# Patient Record
Sex: Female | Born: 1990 | Race: White | Hispanic: No | Marital: Single | Smoking: Never smoker
Health system: Southern US, Community
[De-identification: ages and names within clinical notes are randomized; demographics above are authoritative.]

## PROBLEM LIST (undated history)

## (undated) DIAGNOSIS — E282 Polycystic ovarian syndrome: Secondary | ICD-10-CM

## (undated) DIAGNOSIS — E669 Obesity, unspecified: Secondary | ICD-10-CM

## (undated) DIAGNOSIS — L409 Psoriasis, unspecified: Secondary | ICD-10-CM

## (undated) DIAGNOSIS — D509 Iron deficiency anemia, unspecified: Secondary | ICD-10-CM

## (undated) HISTORY — DX: Psoriasis, unspecified: L40.9

## (undated) HISTORY — DX: Obesity, unspecified: E66.9

## (undated) HISTORY — DX: Iron deficiency anemia, unspecified: D50.9

---

## 2013-12-24 ENCOUNTER — Ambulatory Visit: Payer: Self-pay | Admitting: Family Medicine

## 2014-01-18 ENCOUNTER — Ambulatory Visit: Payer: Self-pay | Admitting: Family Medicine

## 2014-02-05 ENCOUNTER — Emergency Department: Payer: Self-pay | Admitting: Emergency Medicine

## 2014-02-05 LAB — URINALYSIS, COMPLETE
BILIRUBIN, UR: NEGATIVE
Blood: NEGATIVE
Glucose,UR: NEGATIVE mg/dL (ref 0–75)
Hyaline Cast: 1
Ketone: NEGATIVE
LEUKOCYTE ESTERASE: NEGATIVE
Nitrite: NEGATIVE
PH: 5 (ref 4.5–8.0)
Protein: NEGATIVE
Specific Gravity: 1.032 (ref 1.003–1.030)
WBC UR: 3 /HPF (ref 0–5)

## 2014-02-05 LAB — COMPREHENSIVE METABOLIC PANEL
ALBUMIN: 3.9 g/dL (ref 3.4–5.0)
ALT: 26 U/L (ref 12–78)
Alkaline Phosphatase: 113 U/L
Anion Gap: 8 (ref 7–16)
BUN: 13 mg/dL (ref 7–18)
Bilirubin,Total: 0.3 mg/dL (ref 0.2–1.0)
Calcium, Total: 8.8 mg/dL (ref 8.5–10.1)
Chloride: 106 mmol/L (ref 98–107)
Co2: 24 mmol/L (ref 21–32)
Creatinine: 0.92 mg/dL (ref 0.60–1.30)
EGFR (African American): >60
EGFR (Non-African Amer.): >60
GLUCOSE: 92 mg/dL (ref 65–99)
Osmolality: 275 (ref 275–301)
Potassium: 3.7 mmol/L (ref 3.5–5.1)
SGOT(AST): 28 U/L (ref 15–37)
SODIUM: 138 mmol/L (ref 136–145)
Total Protein: 8.7 g/dL — ABNORMAL HIGH (ref 6.4–8.2)

## 2014-02-05 LAB — CBC WITH DIFFERENTIAL/PLATELET
Basophil #: 0.1 10*3/uL (ref 0.0–0.1)
Basophil %: 0.2 %
EOS ABS: 0.2 10*3/uL (ref 0.0–0.7)
EOS PCT: 1.1 %
HCT: 36.8 % (ref 35.0–47.0)
HGB: 11.8 g/dL — ABNORMAL LOW (ref 12.0–16.0)
Lymphocyte #: 2.3 10*3/uL (ref 1.0–3.6)
Lymphocyte %: 10.1 %
MCH: 23.6 pg — ABNORMAL LOW (ref 26.0–34.0)
MCHC: 31.9 g/dL — ABNORMAL LOW (ref 32.0–36.0)
MCV: 74 fL — ABNORMAL LOW (ref 80–100)
Monocyte #: 1.7 x10 3/mm — ABNORMAL HIGH (ref 0.2–0.9)
Monocyte %: 7.4 %
NEUTROS ABS: 18.4 10*3/uL — AB (ref 1.4–6.5)
Neutrophil %: 81.2 %
PLATELETS: 330 10*3/uL (ref 150–440)
RBC: 4.98 10*6/uL (ref 3.80–5.20)
RDW: 15.7 % — ABNORMAL HIGH (ref 11.5–14.5)
WBC: 22.6 10*3/uL — AB (ref 3.6–11.0)

## 2014-02-05 LAB — PREGNANCY, URINE: PREGNANCY TEST, URINE: NEGATIVE m[IU]/mL

## 2014-02-05 LAB — LIPASE, BLOOD: Lipase: 228 U/L (ref 73–393)

## 2015-03-07 IMAGING — CT CT ABD-PELV W/ CM
2 of 4 series · 16 of 46 positions shown, 18 images · IV contrast (isovue)
Comparison: US ABDOMEN LIMITED RUQ/ASCITES dated 02/06/2014

CLINICAL DATA: Upper abdominal pain, nausea.

EXAM:
CT ABDOMEN AND PELVIS WITH CONTRAST
TECHNIQUE: Multidetector CT imaging of the abdomen and pelvis was performed
using the standard protocol following bolus administration of
intravenous contrast.
CONTRAST:  100 cc Isovue 370 IV.

[Series 2: routine abd pel with · axial · 0.72mm/px · z∈[-869,-444]mm · 13 of 93 slices shown, 15 images]
[im 4/93  soft-tissue]
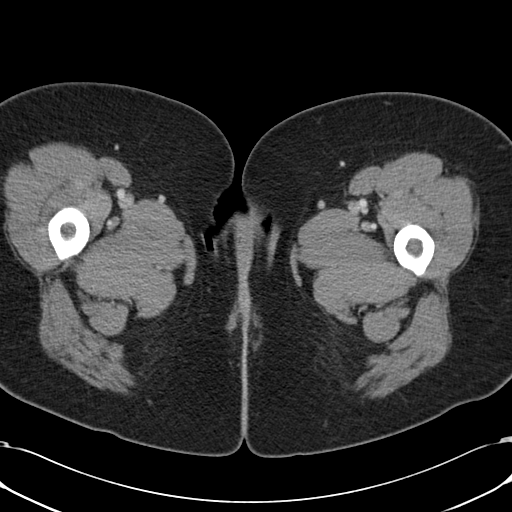
[im 4/93  bone]
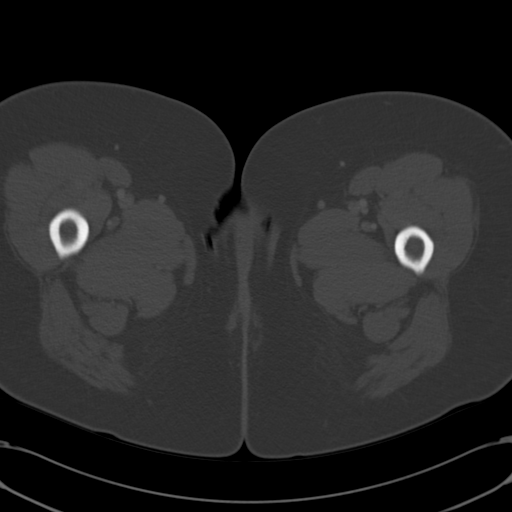
[im 12/93  soft-tissue]
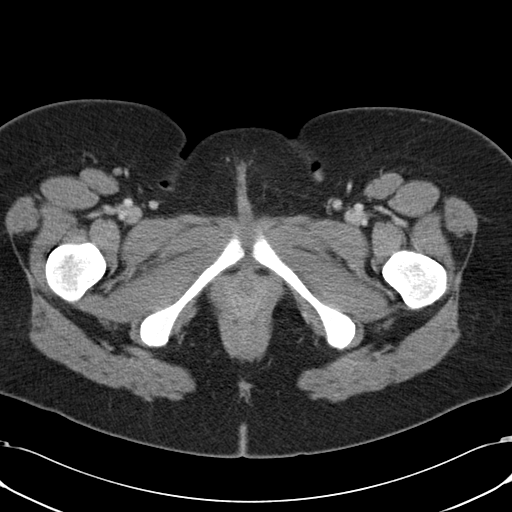
[im 20/93  soft-tissue]
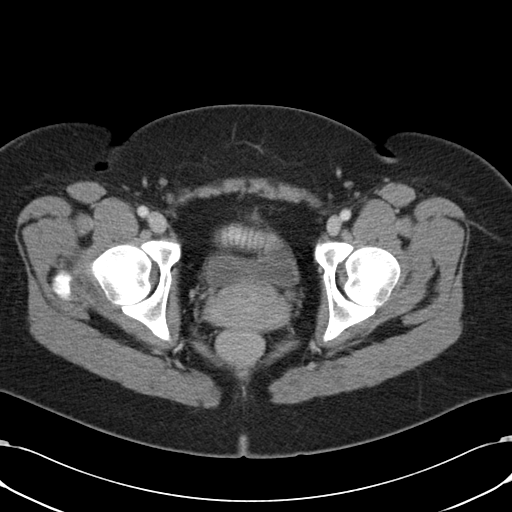
[im 27/93  soft-tissue]
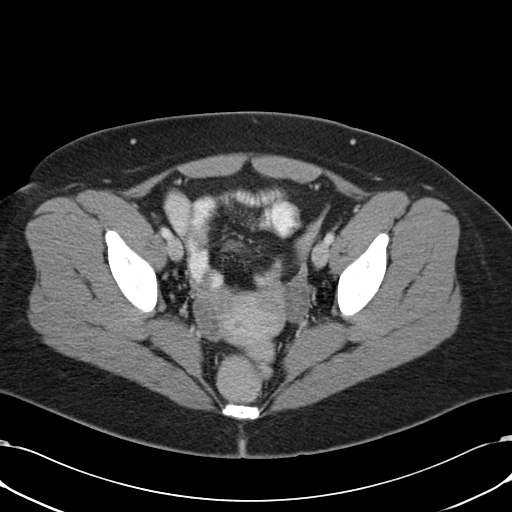
[im 31/93  soft-tissue]
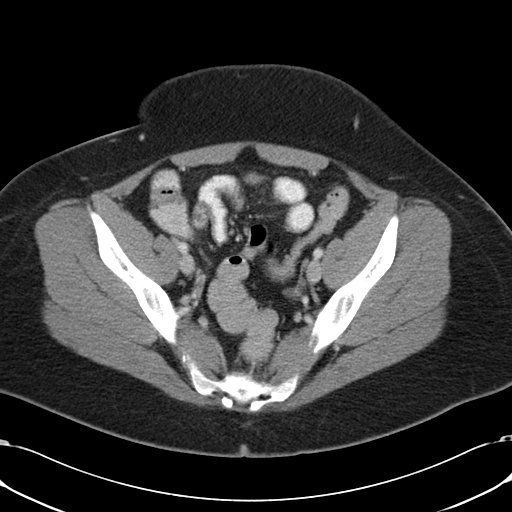
[im 39/93  soft-tissue]
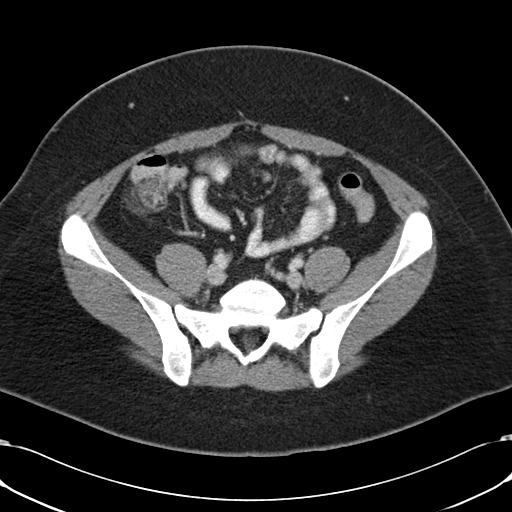
[im 47/93  soft-tissue]
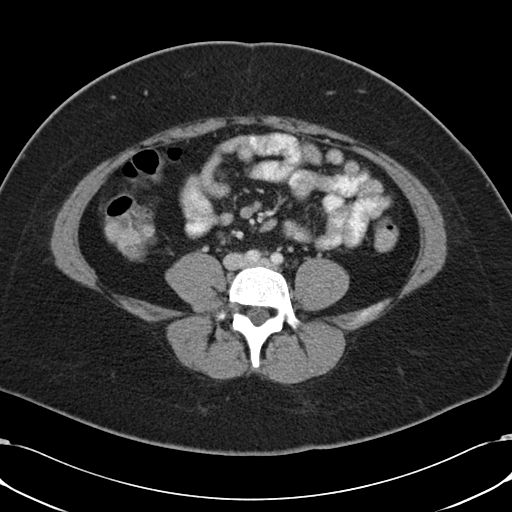
[im 54/93  soft-tissue]
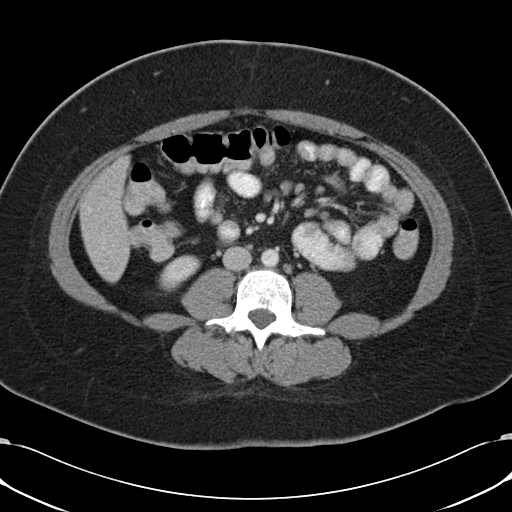
[im 62/93  soft-tissue]
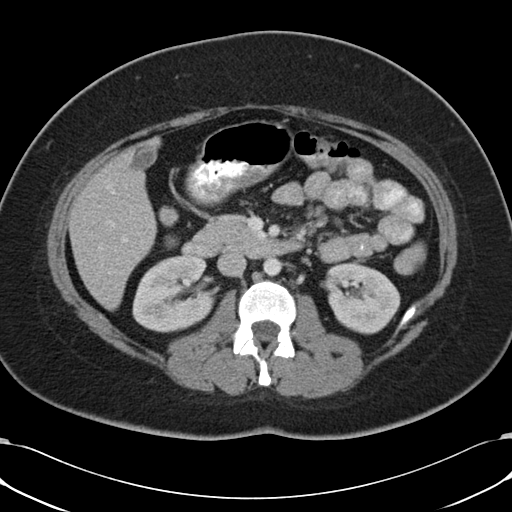
[im 62/93  bone]
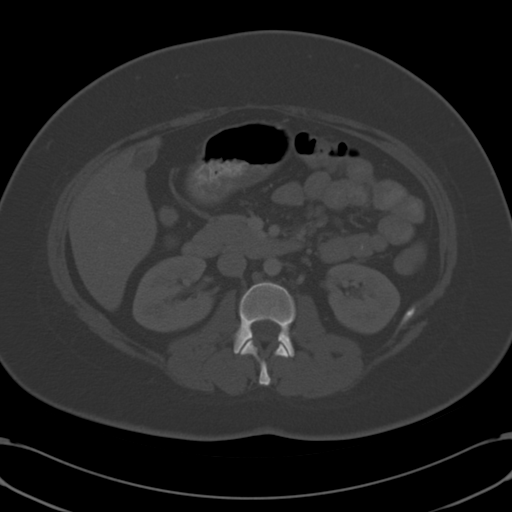
[im 66/93  soft-tissue]
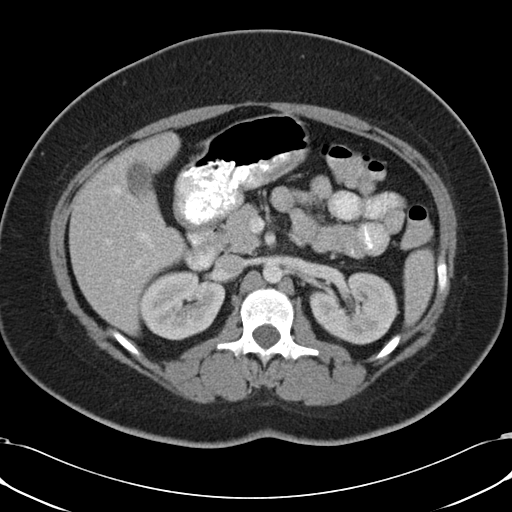
[im 73/93  soft-tissue]
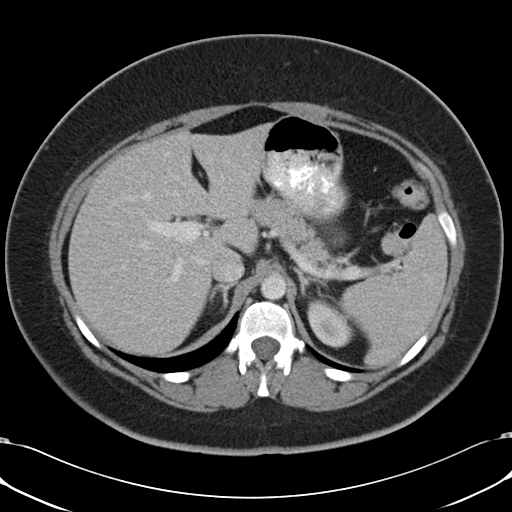
[im 81/93  soft-tissue]
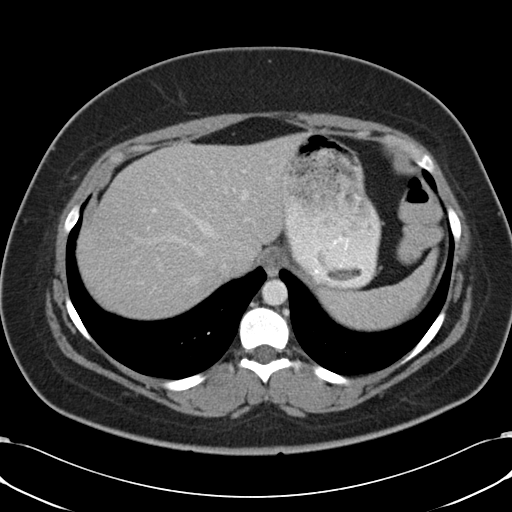
[im 89/93  soft-tissue]
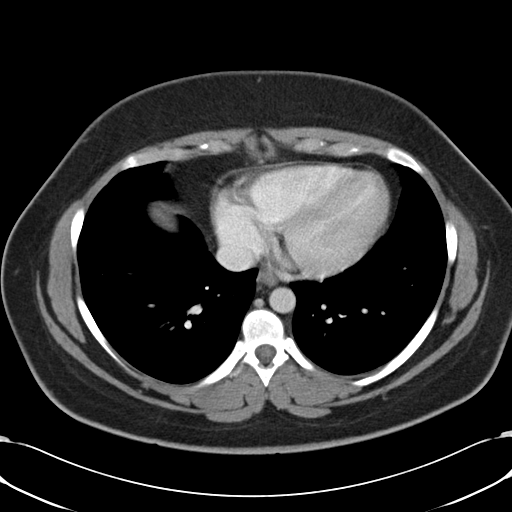

[Series 5: cor routine abd pel with · coronal · 0.62mm/px · 3 of 137 slices shown]
[im 46/137  soft-tissue]
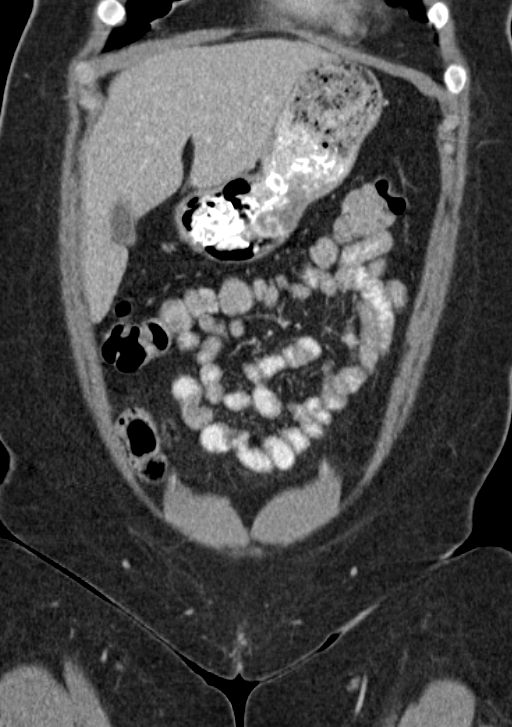
[im 61/137  soft-tissue]
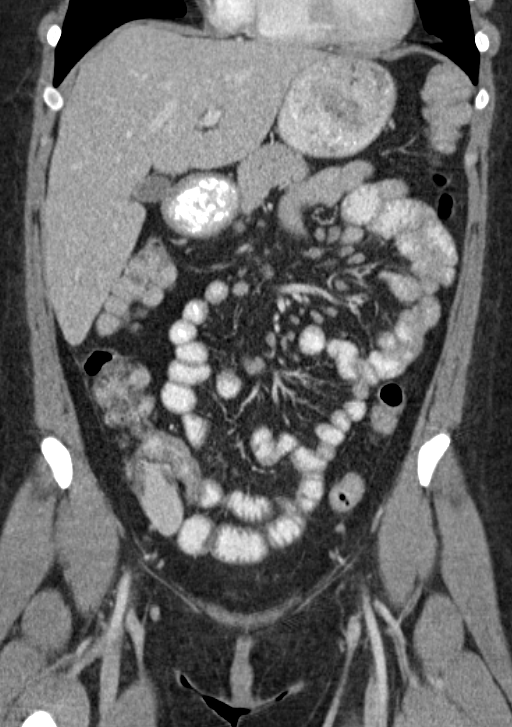
[im 76/137  soft-tissue]
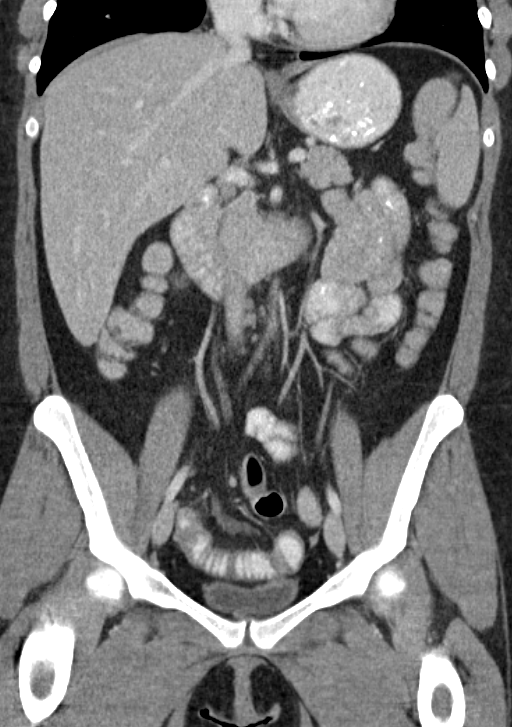

[16 of 46 positions shown; findings below may reference images not displayed]

FINDINGS: Lung bases are clear. No effusions. Heart is normal size.

Liver, gallbladder, spleen, pancreas, adrenals and kidneys are
normal.

Appendix is visualized and is normal. Uterus, adnexae and urinary
bladder unremarkable.

Mildly prominent central mesenteric lymph nodes may reflect
mesenteric adenitis. No free air or free fluid. Aorta is normal
caliber.

No acute bony abnormality.
IMPRESSION: Mildly prominent central mesenteric lymph nodes, question mesenteric
adenitis.

Normal appendix.

## 2015-03-07 IMAGING — US ABDOMEN ULTRASOUND LIMITED
1 series · 14 of 25 positions shown · non-contrast
Comparison: Concern none available

CLINICAL DATA: Right upper quadrant pain, with leukocytosis.

EXAM:
US ABDOMEN LIMITED - RIGHT UPPER QUADRANT

[Series 1: abdomen ultrasound limited · 0.45mm/px · 14 of 40 slices shown]
[im 1/40]
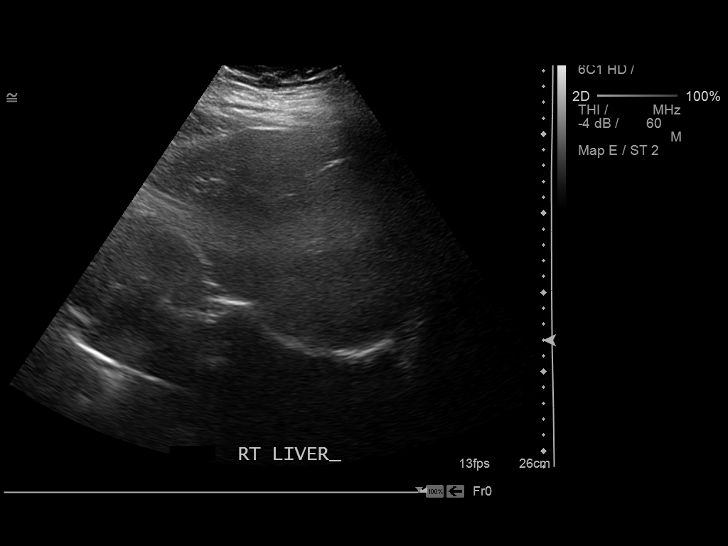
[im 4/40]
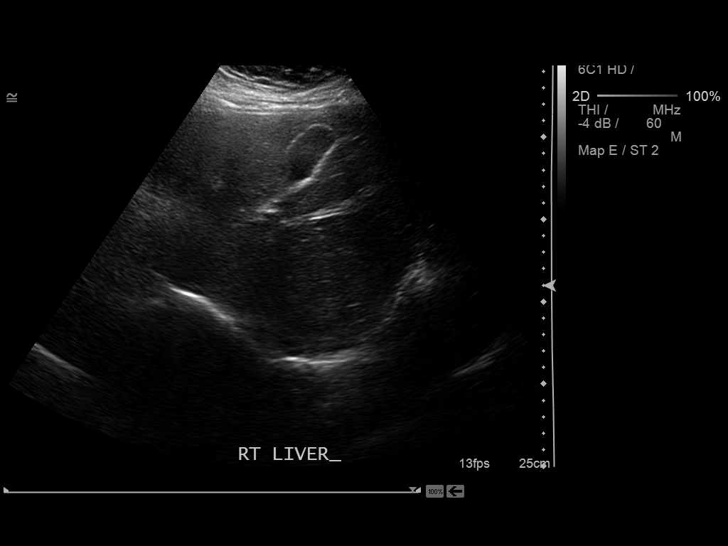
[im 7/40]
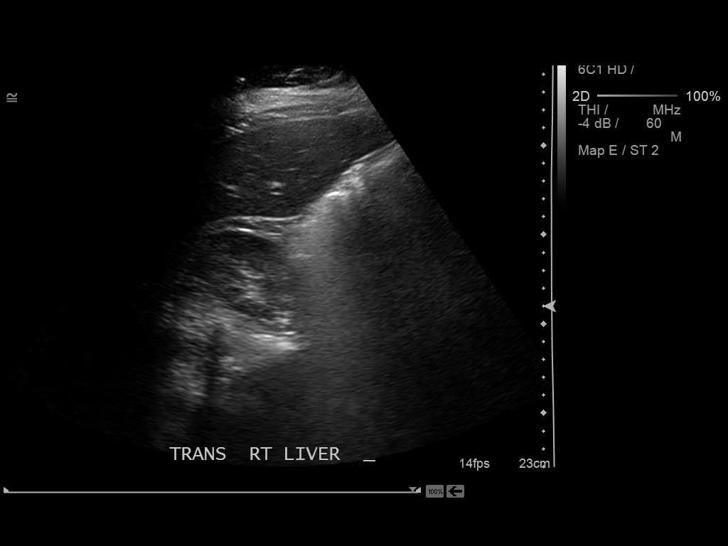
[im 10/40]
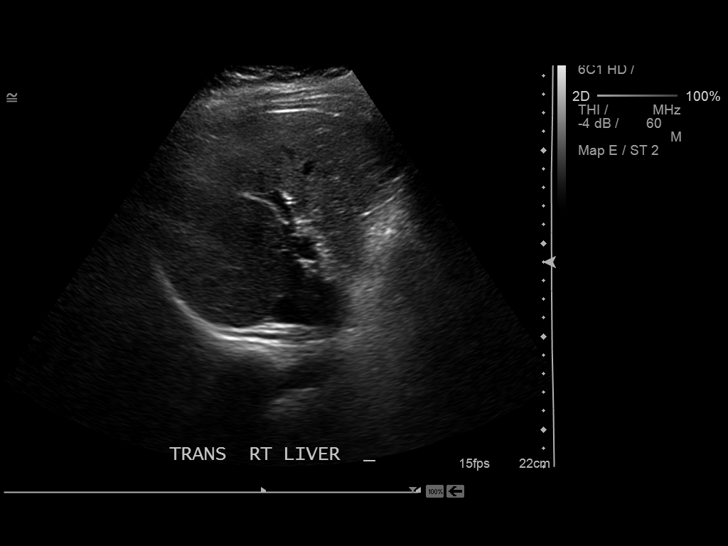
[im 14/40]
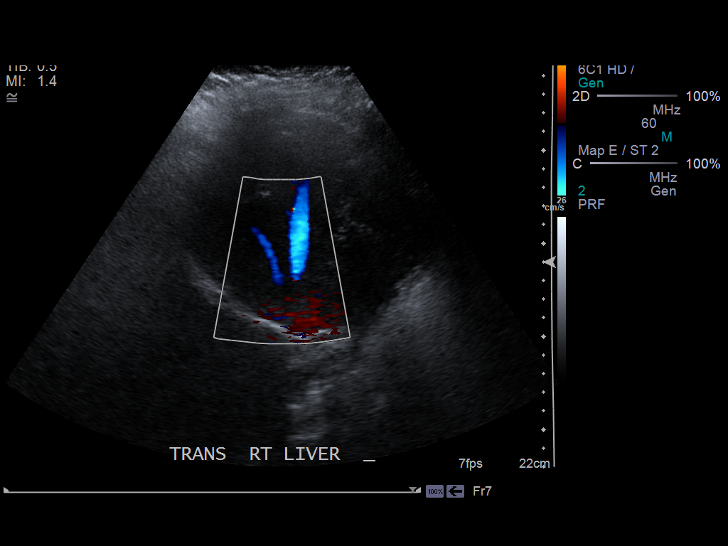
[im 15/40]
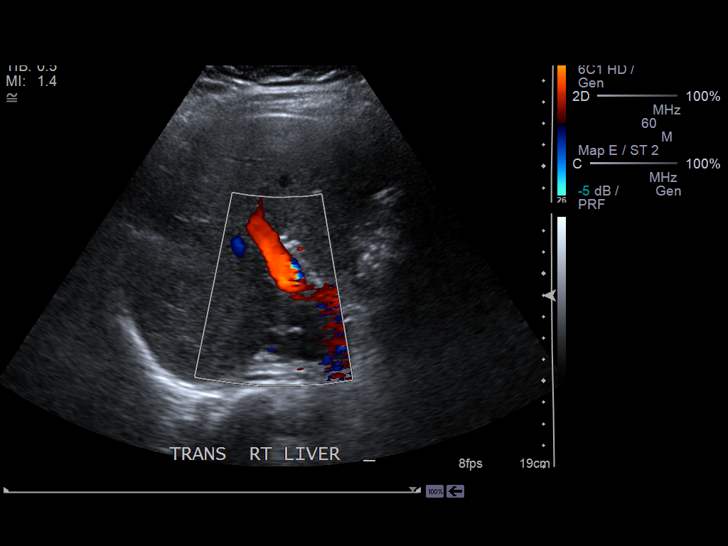
[im 18/40]
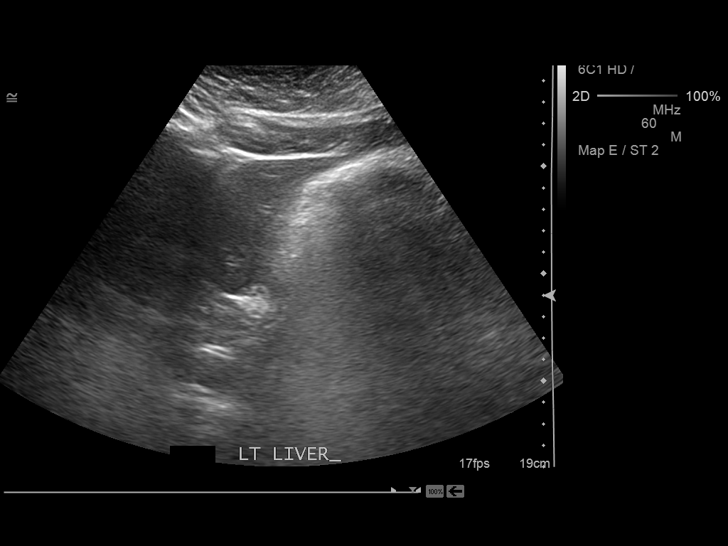
[im 22/40]
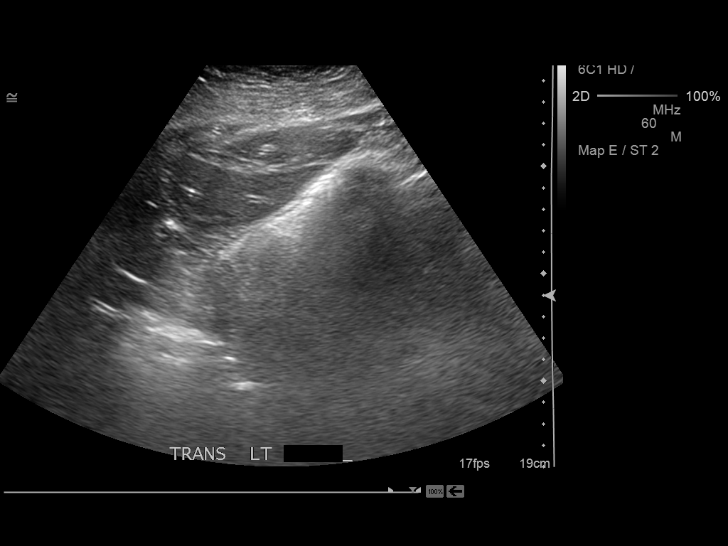
[im 25/40]
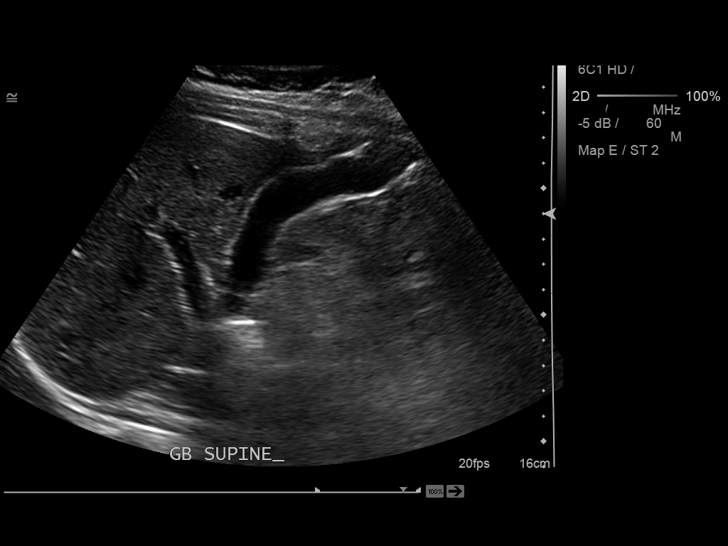
[im 27/40]
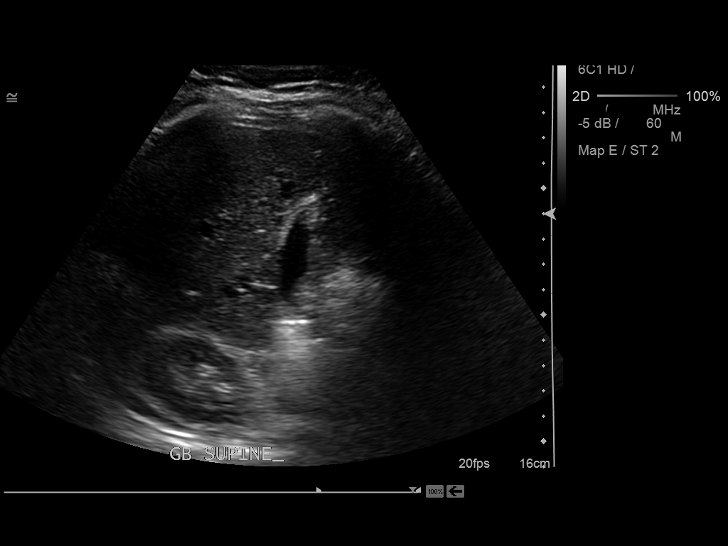
[im 30/40]
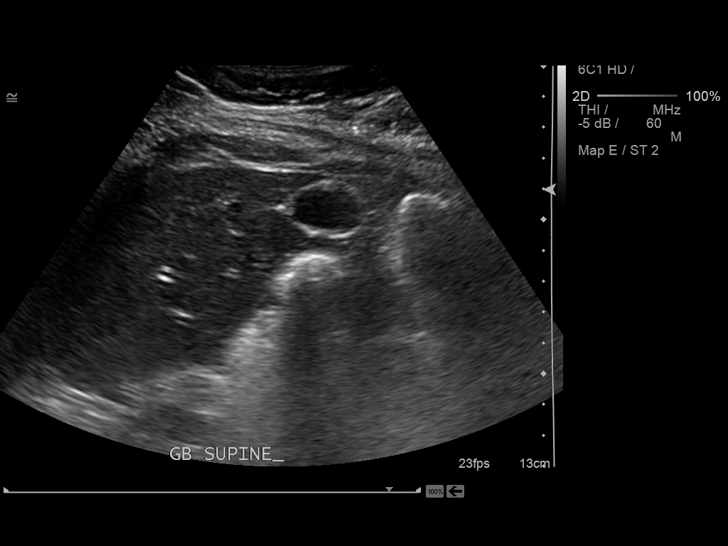
[im 33/40]
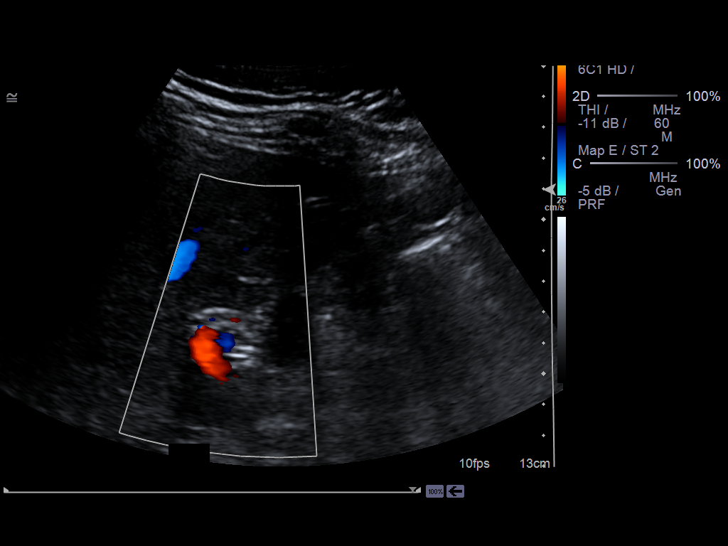
[im 36/40]
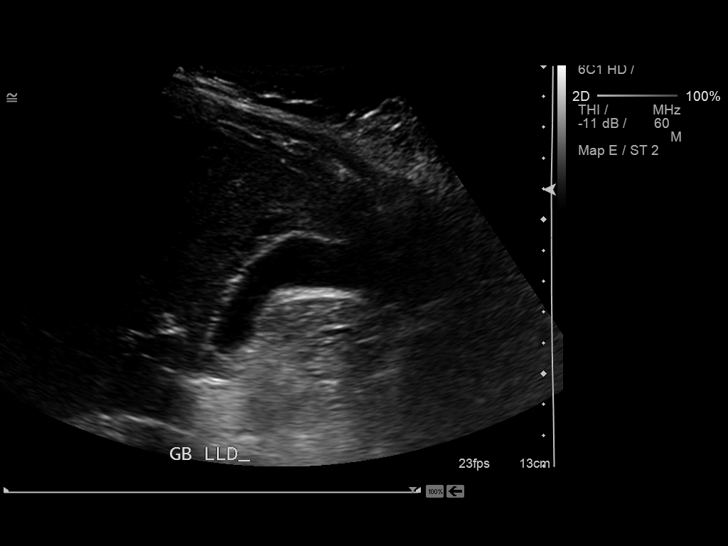
[im 40/40]
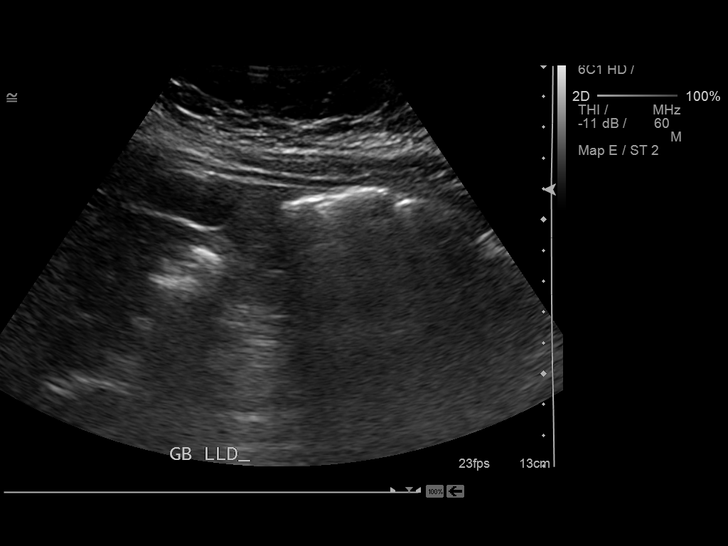

[14 of 25 positions shown; findings below may reference images not displayed]

FINDINGS: Gallbladder:

No gallstones or wall thickening visualized. No sonographic Murphy
sign noted.

Common bile duct:

Diameter: 4 mm

Liver:

The liver is 16 cm in craniocaudad dimension, overall normal
morphology in echogenicity, hepatopetal portal vein. No perihepatic
free fluid.
IMPRESSION: No sonographic findings of cholelithiasis nor cholecystitis.

  By: Savio Locklear

## 2015-06-03 ENCOUNTER — Encounter: Payer: Self-pay | Admitting: Emergency Medicine

## 2015-06-03 ENCOUNTER — Ambulatory Visit
Admission: EM | Admit: 2015-06-03 | Discharge: 2015-06-03 | Disposition: A | Discharge status: Home / Self Care | Payer: 59 | Attending: Internal Medicine | Admitting: Internal Medicine

## 2015-06-03 ENCOUNTER — Telehealth: Payer: Self-pay

## 2015-06-03 DIAGNOSIS — Z793 Long term (current) use of hormonal contraceptives: Secondary | ICD-10-CM | POA: Diagnosis not present

## 2015-06-03 DIAGNOSIS — E282 Polycystic ovarian syndrome: Secondary | ICD-10-CM | POA: Diagnosis not present

## 2015-06-03 DIAGNOSIS — R1032 Left lower quadrant pain: Secondary | ICD-10-CM | POA: Insufficient documentation

## 2015-06-03 DIAGNOSIS — L409 Psoriasis, unspecified: Secondary | ICD-10-CM | POA: Diagnosis not present

## 2015-06-03 DIAGNOSIS — R109 Unspecified abdominal pain: Secondary | ICD-10-CM | POA: Diagnosis present

## 2015-06-03 DIAGNOSIS — Z30018 Encounter for initial prescription of other contraceptives: Secondary | ICD-10-CM

## 2015-06-03 HISTORY — DX: Polycystic ovarian syndrome: E28.2

## 2015-06-03 LAB — CBC WITH DIFFERENTIAL/PLATELET
BASOS ABS: 0 10*3/uL (ref 0–0.1)
Basophils Relative: 0 %
Eosinophils Absolute: 0.2 10*3/uL (ref 0–0.7)
Eosinophils Relative: 2 %
HCT: 39.9 % (ref 35.0–47.0)
Hemoglobin: 13.1 g/dL (ref 12.0–16.0)
Lymphocytes Relative: 26 %
Lymphs Abs: 3 10*3/uL (ref 1.0–3.6)
MCH: 27.2 pg (ref 26.0–34.0)
MCHC: 32.8 g/dL (ref 32.0–36.0)
MCV: 82.9 fL (ref 80.0–100.0)
MONO ABS: 1 10*3/uL — AB (ref 0.2–0.9)
Monocytes Relative: 9 %
Neutro Abs: 7.2 10*3/uL — ABNORMAL HIGH (ref 1.4–6.5)
Neutrophils Relative %: 63 %
PLATELETS: 249 10*3/uL (ref 150–440)
RBC: 4.82 MIL/uL (ref 3.80–5.20)
RDW: 13.5 % (ref 11.5–14.5)
WBC: 11.5 10*3/uL — ABNORMAL HIGH (ref 3.6–11.0)

## 2015-06-03 LAB — PREGNANCY, URINE: PREG TEST UR: NEGATIVE

## 2015-06-03 LAB — URINALYSIS COMPLETE WITH MICROSCOPIC (ARMC ONLY)
BILIRUBIN URINE: NEGATIVE
Glucose, UA: NEGATIVE mg/dL
Hgb urine dipstick: NEGATIVE
Ketones, ur: NEGATIVE mg/dL
Nitrite: NEGATIVE
Protein, ur: NEGATIVE mg/dL
Specific Gravity, Urine: 1.025 (ref 1.005–1.030)
pH: 5.5 (ref 5.0–8.0)

## 2015-06-03 LAB — WET PREP, GENITAL
CLUE CELLS WET PREP: NONE SEEN
TRICH WET PREP: NONE SEEN
YEAST WET PREP: NONE SEEN

## 2015-06-03 LAB — CHLAMYDIA/NGC RT PCR (ARMC ONLY)
CHLAMYDIA TR: NOT DETECTED
N GONORRHOEAE: NOT DETECTED

## 2015-06-03 LAB — HCG, QUANTITATIVE, PREGNANCY: hCG, Beta Chain, Quant, S: <1 m[IU]/mL (ref <5)

## 2015-06-03 MED ORDER — NORGESTIM-ETH ESTRAD TRIPHASIC 0.18/0.215/0.25 MG-25 MCG PO TABS: 1.0000 | ORAL_TABLET | Freq: Every day | ORAL | Status: DC

## 2015-06-03 MED ORDER — CLOBETASOL PROPIONATE 0.05 % EX OINT: 1.0000 "application " | TOPICAL_OINTMENT | Freq: Two times a day (BID) | CUTANEOUS | Status: DC

## 2015-06-03 NOTE — Telephone Encounter (Signed)
Patient called and had left a message stating she had called the on call after hours nurse and was told to call our office when we opened.

## 2015-06-03 NOTE — Telephone Encounter (Signed)
Patient called back, I advised her we could get her in this afternoon. She wanted to know if we could go ahead and order her an ultrasound and I advised her we could not without seeing her first and she hasn't been seen here in over a year so we can't legally order anything for her until she is seen. I advised her that if she did not want to wait, and the pain is that severe, that she should go ahead and go to the ER for eval and scans. Pt. Said "ok" and then hung up on me.

## 2015-06-03 NOTE — Telephone Encounter (Signed)
She would need an appt I can't diagnose this over the phone

## 2015-06-03 NOTE — ED Notes (Signed)
Patient c/o lower abdominal pain since yesterday.  Patient denies N/V.  Patient denies any urinary symptoms.

## 2015-06-03 NOTE — Discharge Instructions (Signed)
L lower abdominal/pelvic discomfort seems mostly likely related to restarting the birth control pill.  This should improve on its own over the next week or two.  Ibuprofen or aleve may help with mild discomfort. Go to ER for severe/persistent pain.  See Dr Sherie Don in a week or two if pain persists, and also to discuss continuing the birth control pill.  Prescription for a month's supply was sent to the [facility] pharmacy today.

## 2015-06-03 NOTE — ED Provider Notes (Signed)
CSN: [id number]     Arrival date & time 06/03/15  1019 History   First MD Initiated Contact with Patient 06/03/15 1142     Chief Complaint  Patient presents with  . Abdominal Pain   HPI  Patient had the onset of left lower quadrant pain yesterday, has gotten somewhat worse since then. Discomfort is sharp, about a 2 at rest, if she is bending though it increases to as high as a 7. Has a history of irregular menses and PCOS; was prescribed Ortho Tri-Cyclen in the past, and restarted this medicine 4 days ago after being off of it for 4 months. Not currently menstruating, no unusual vaginal bleeding or discharge. No urinary frequency, no dysuria. No change in bowel movements, last normal bowel movement was yesterday. No nausea, no vomiting, appetite normal. No history of abdominal or pelvic surgeries, no fever, no malaise. No mass appreciated.   Past Medical History  Diagnosis Date  . Polycystic ovarian disease   anemia psoriasis   History reviewed. No pertinent past surgical history. History reviewed. No pertinent family history. History  Substance Use Topics  . Smoking status: Never Smoker   . Smokeless tobacco: Never Used  . Alcohol Use: No   OB History    No data available     Review of Systems  All other systems reviewed and are negative.   Allergies  Penicillins  Home Medications   Prior to Admission medications   Medication Sig Start Date End Date Taking? Authorizing Provider  Norgestimate-Ethinyl Estradiol Triphasic (ORTHO TRI-CYCLEN LO) 0.18/0.215/0.25 MG-25 MCG tab Take 1 tablet by mouth daily.   Yes Historical Provider, MD   BP 126/74 mmHg  Pulse 66  Temp(Src) 97.5 F (36.4 C) (Tympanic)  Resp 16  Ht 5' (1.524 m)  Wt 190 lb (86.183 kg)  BMI 37.11 kg/m2  SpO2 100%  LMP  (Approximate) Physical Exam  Constitutional: She is oriented to person, place, and time. No distress.  Alert, nicely groomed  HENT:  Head: Atraumatic.  Eyes:  Conjugate gaze, no eye  redness/drainage  Neck: Neck supple.  Cardiovascular: Normal rate and regular rhythm.   Pulmonary/Chest: No respiratory distress.  Lungs clear, symmetric breath sounds  Abdominal: She exhibits no distension and no mass. There is no rebound and no guarding.  Mildly tender to deep palpation in the left lower quadrant, focal area.  Genitourinary:  Mildly inflamed vag mucosa without lesion; no cervical motion tenderness, no R adnexal mass/tenderness; mild L adnexal tenderness, no mass  Musculoskeletal: Normal range of motion.  No leg swelling  Neurological: She is alert and oriented to person, place, and time.  Skin: Skin is warm and dry.  No cyanosis Patient has a history of psoriasis, and notes several small red/flaky patches on the forearms.  Nursing note and vitals reviewed.   ED Course  Procedures (including critical care time) Labs Review Labs Reviewed  WET PREP, GENITAL - Abnormal; Notable for the following:    WBC, Wet Prep HPF POC FEW (*)    All other components within normal limits  URINALYSIS COMPLETEWITH MICROSCOPIC ([facility] ONLY) - Abnormal; Notable for the following:    Color, Urine STRAW (*)    APPearance CLOUDY (*)    Leukocytes, UA 2+ (*)    Bacteria, UA MANY (*)    Squamous Epithelial / LPF TOO NUMEROUS TO COUNT (*)    All other components within normal limits  CBC WITH DIFFERENTIAL/PLATELET - Abnormal; Notable for the following:    WBC 11.5 (*)  Neutro Abs 7.2 (*)    Monocytes Absolute 1.0 (*)    All other components within normal limits  URINE CULTURE  CHLAMYDIA/NGC RT PCR ([facility] ONLY)  PREGNANCY, URINE  HCG, QUANTITATIVE, PREGNANCY    Imaging Review No results found.   MDM   1. Abdominal discomfort in left lower quadrant   2. Hormonal contraceptive    Suspect L lower abd/pelvic discomfort is related to recent restart of OCP.  Serum hcg negative and benign abd/pelvic exam at urgent care today; would observe for gradual improvement over the  next 7-10 days.  FU pcp/Dr Sherie Don to discuss further evaluation if persistent, and to discuss continuing OCP; 1 mo refill given for OCP.   Urine cx pending; GC/chlamydia swab pending.      Eustace Moore, MD 06/03/15 1352

## 2015-06-03 NOTE — Telephone Encounter (Signed)
I returned patient's call. She states that yesterday she started having pain in the left inguinal area. Walking did not hurt, but if she bent over, it was about a 3/10 pain. This morning it is worse, pain now when walking of a 3/10 and if she bends over and/or twists, then the pain bumps up to 6-7/10. She wants advice on what to do.

## 2015-06-05 LAB — URINE CULTURE: Special Requests: NORMAL

## 2015-06-08 DIAGNOSIS — D509 Iron deficiency anemia, unspecified: Secondary | ICD-10-CM | POA: Insufficient documentation

## 2015-06-08 DIAGNOSIS — L409 Psoriasis, unspecified: Secondary | ICD-10-CM | POA: Insufficient documentation

## 2015-06-11 ENCOUNTER — Encounter: Payer: Self-pay | Admitting: Family Medicine

## 2015-06-11 ENCOUNTER — Ambulatory Visit (INDEPENDENT_AMBULATORY_CARE_PROVIDER_SITE_OTHER): Payer: 59 | Admitting: Family Medicine

## 2015-06-11 VITALS — BP 123/79 | HR 60 | Temp 99.4°F | Ht 60.0 in | Wt 218.0 lb

## 2015-06-11 DIAGNOSIS — E7800 Pure hypercholesterolemia, unspecified: Secondary | ICD-10-CM

## 2015-06-11 DIAGNOSIS — E669 Obesity, unspecified: Secondary | ICD-10-CM

## 2015-06-11 DIAGNOSIS — E282 Polycystic ovarian syndrome: Secondary | ICD-10-CM

## 2015-06-11 DIAGNOSIS — Z0000 Encounter for general adult medical examination without abnormal findings: Secondary | ICD-10-CM | POA: Diagnosis not present

## 2015-06-11 DIAGNOSIS — L659 Nonscarring hair loss, unspecified: Secondary | ICD-10-CM

## 2015-06-11 DIAGNOSIS — L680 Hirsutism: Secondary | ICD-10-CM

## 2015-06-11 DIAGNOSIS — N926 Irregular menstruation, unspecified: Secondary | ICD-10-CM

## 2015-06-11 DIAGNOSIS — R5383 Other fatigue: Secondary | ICD-10-CM

## 2015-06-11 MED ORDER — METFORMIN HCL ER 500 MG PO TB24: 500.0000 mg | ORAL_TABLET | Freq: Every day | ORAL | Status: DC

## 2015-06-11 MED ORDER — NORGESTIM-ETH ESTRAD TRIPHASIC 0.18/0.215/0.25 MG-25 MCG PO TABS
1.0000 | ORAL_TABLET | Freq: Every day | ORAL | Status: DC
Start: 2015-06-11 — End: 2015-10-18

## 2015-06-11 NOTE — Addendum Note (Signed)
Addended byClaudine Mouton J on: 06/11/2015 03:46 PM   Modules accepted: Orders, SmartSet

## 2015-06-11 NOTE — Addendum Note (Signed)
Addended byClaudine Mouton J on: 06/11/2015 04:17 PM   Modules accepted: SmartSet

## 2015-06-11 NOTE — Patient Instructions (Addendum)
GINA --> please request last pap smear report and physical note from gynecologist; request note and labs from urgent care  You received the 3rd and final HPV vaccine here today  Check out the information at familydoctor.org entitled "What It Takes to Lose Weight" Try to lose between 1-2 pounds per week by taking in fewer calories and burning off more calories You can succeed by limiting portions, limiting foods dense in calories and fat, becoming more active, and drinking 8 glasses of water a day Don't skip meals, especially breakfast, as skipping meals may alter your metabolism Do not use over-the-counter weight loss pills or gimmicks that claim rapid weight loss A healthy BMI (or body mass index) is between 18.5 and 24.9 You can calculate your ideal BMI at the NIH website [employer]  Start the metformin once a day; call us in about 4 weeks before you run out medicine to let us know how you're doing on it

## 2015-06-11 NOTE — Progress Notes (Signed)
BP 123/79 mmHg  Pulse 60  Temp(Src) 99.4 F (37.4 C)  Ht 5' (1.524 m)  Wt 218 lb (98.884 kg)  BMI 42.58 kg/m2  SpO2 99%  LMP  (LMP Unknown)   Subjective:    Patient ID: Kayla Hammond, female    DOB: 02/22/1991, 24 y.o.   MRN: [id number]  HPI: Kayla Hammond is a 24 y.o. female  Chief Complaint  Patient presents with  . Annual Exam    unsure if she had a pap last year or not   She had blood count done just last week at urgent care; she was having some left lower quadrant pain and they said it was from starting birth control back up; we prescribe the birth control Last pap smear was last year and GYN and it was normal  Depression screen [facility] 2/9 06/11/2015  Decreased Interest 0  Down, Depressed, Hopeless 0  PHQ - 2 Score 0   PCOS; hair and weight and irregular periods; she saw GYN and that doctor started metformin; started low dose and then worked up; had a lot of GI distress; would be willing to try it again; was not as consistent as she could have been Constipation; occasional blood in stool BR, Miralax has helped in the past; going on for 10 years off and on Obesity; thinks it is related to her PCOS Had a spontaneous period for 1.5 days, like a period enough to use a tampon a month; just started OCPs this month  Relevant past medical, surgical, family and social history reviewed and updated as indicated. Interim medical history since our last visit reviewed. Allergies and medications reviewed and updated.  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 Negative for fever.       Thinks her weight is related to the PCOS; tried really hard to lose weight, went to gym 3 months and no loss  Gastrointestinal: Positive for constipation (sometimes, none in the last week though) and blood in stool (with hard bowel movements; going on for a long time, saw her  pediatrician about it 10 years ago, going on a  long time; off and on, less than 10 times a day). Negative for nausea, vomiting and diarrhea.  Endocrine:       Has PCOS and that drives it crazy, has the hair growth, very aggravating, periods are very irregular  Genitourinary: Positive for vaginal discharge (has always had it, no odor; just dampness in panties or a little white spot in the panties).  Musculoskeletal: Negative for joint swelling.  Skin: Positive for color change (back of the neck) and rash (arms).       Thin hair; psoriasis; does not see derm  Allergic/Immunologic: Negative for environmental allergies and food allergies.  Neurological: Negative for tremors, numbness and headaches.  Hematological: Does not bruise/bleed easily.  Psychiatric/Behavioral: Negative for dysphoric mood.    Per HPI unless specifically indicated above     Objective:    BP 123/79 mmHg  Pulse 60  Temp(Src) 99.4 F (37.4 C)  Ht 5' (1.524 m)  Wt 218 lb (98.884 kg)  BMI 42.58 kg/m2  SpO2 99%  LMP  (LMP Unknown)  Wt Readings from Last 3 Encounters:  06/11/15 218 lb (98.884 kg)  05/06/14 206 lb (93.441 kg)  06/03/15 190 lb (86.183 kg)    Physical Exam  Constitutional: She appears well-developed and well-nourished.  HENT:  Head: Normocephalic and atraumatic.  Eyes: Conjunctivae and EOM are normal. Right eye exhibits no hordeolum. Left eye exhibits no hordeolum. No scleral icterus.  Neck: Carotid bruit is not present. No thyromegaly present.  Cardiovascular: Normal rate, regular rhythm, S1 normal, S2 normal and normal heart sounds.   No extrasystoles are present.  Pulmonary/Chest: Effort normal and breath sounds normal. No respiratory distress. Right breast exhibits no inverted nipple, no mass, no nipple discharge, no skin change and no tenderness. Left breast exhibits no inverted nipple, no mass, no nipple discharge, no skin change and no tenderness. Breasts are symmetrical.  Abdominal: Soft.  Normal appearance and bowel sounds are normal. She exhibits no distension, no abdominal bruit, no pulsatile midline mass and no mass. There is no hepatosplenomegaly. There is no tenderness. No hernia.  Genitourinary: Uterus normal. Pelvic exam was performed with patient prone. There is no rash or lesion on the right labia. There is no rash or lesion on the left labia. Cervix exhibits no motion tenderness. Right adnexum displays no mass, no tenderness and no fullness. Left adnexum displays no mass, no tenderness and no fullness.  Thin prep not collected today; UTD  Musculoskeletal: Normal range of motion. She exhibits no edema.  Lymphadenopathy:       Head (right side): No submandibular adenopathy present.       Head (left side): No submandibular adenopathy present.    She has no cervical adenopathy.    She has no axillary adenopathy.  Neurological: She is alert. She displays no tremor. No cranial nerve deficit. She exhibits normal muscle tone. Gait normal.  Skin: Skin is warm and dry. Rash (clusters of mildly erythematous lesions papular on the arms) noted. No bruising and no ecchymosis noted. No cyanosis. No pallor. Nails show no clubbing.  Skin changes consistent with acanthosis nigricans on the nape of the neck  Psychiatric: Her speech is normal and behavior is normal. Thought content normal. Her mood appears not anxious. She does not exhibit a depressed mood.       Assessment & Plan:   Problem List Items Addressed This Visit      Endocrine   PCOS (polycystic ovarian syndrome)   Relevant Orders   LH   FSH   DHEA-sulfate     Other   Obesity   Relevant Medications   metFORMIN (GLUCOPHAGE XR) 500 MG 24 hr tablet    Other Visit Diagnoses    Preventative health care    -  Primary    age-appropriate testing, guidance given; pap smear UTD per patient, requesting report; every 3 years if neg    Relevant Orders    Comprehensive metabolic panel    Lipid Panel w/o Chol/HDL Ratio     Irregular periods        PCOS; check labs; continue OCPs    Relevant Orders    TSH    LH    FSH    Hirsutism        check labs, likely androgenic component to PCOS    Relevant Orders    Testosterone    DHEA-sulfate    Other fatigue        check labs    Relevant Orders    Vitamin B12    Vit D  25 hydroxy (rtn osteoporosis monitoring)    TSH    Hair loss        labs    High cholesterol        check lipid panel; weight  loss and healthy eating        Follow up plan: Return in about 3 months (around 09/11/2015) for PCOS and 1 year for physical.

## 2015-06-12 ENCOUNTER — Encounter: Payer: Self-pay | Admitting: Family Medicine

## 2015-06-12 LAB — COMPREHENSIVE METABOLIC PANEL
A/G RATIO: 1.3 (ref 1.1–2.5)
ALK PHOS: 74 IU/L (ref 39–117)
ALT: 13 IU/L (ref 0–32)
AST: 16 IU/L (ref 0–40)
Albumin: 4.3 g/dL (ref 3.5–5.5)
BUN/Creatinine Ratio: 14 (ref 8–20)
BUN: 11 mg/dL (ref 6–20)
Bilirubin Total: 0.3 mg/dL (ref 0.0–1.2)
CHLORIDE: 100 mmol/L (ref 97–108)
CO2: 23 mmol/L (ref 18–29)
CREATININE: 0.76 mg/dL (ref 0.57–1.00)
Calcium: 9.6 mg/dL (ref 8.7–10.2)
GFR calc Af Amer: 129 mL/min/{1.73_m2} (ref >59)
GFR calc non Af Amer: 112 mL/min/{1.73_m2} (ref >59)
GLUCOSE: 100 mg/dL — AB (ref 65–99)
Globulin, Total: 3.2 g/dL (ref 1.5–4.5)
POTASSIUM: 4.6 mmol/L (ref 3.5–5.2)
Sodium: 139 mmol/L (ref 134–144)
Total Protein: 7.5 g/dL (ref 6.0–8.5)

## 2015-06-12 LAB — VITAMIN B12: VITAMIN B 12: 166 pg/mL — AB (ref 211–946)

## 2015-06-12 LAB — LUTEINIZING HORMONE: LH: 9.1 m[IU]/mL

## 2015-06-12 LAB — LIPID PANEL W/O CHOL/HDL RATIO
CHOLESTEROL TOTAL: 173 mg/dL (ref 100–199)
HDL: 38 mg/dL — AB (ref >39)
LDL Calculated: 89 mg/dL (ref 0–99)
Triglycerides: 228 mg/dL — ABNORMAL HIGH (ref 0–149)
VLDL Cholesterol Cal: 46 mg/dL — ABNORMAL HIGH (ref 5–40)

## 2015-06-12 LAB — DHEA-SULFATE: DHEA SO4: 213.7 ug/dL (ref 110.0–431.7)

## 2015-06-12 LAB — FOLLICLE STIMULATING HORMONE: FSH: 5.5 m[IU]/mL

## 2015-06-12 LAB — TESTOSTERONE: Testosterone: 40 ng/dL (ref 8–48)

## 2015-06-12 LAB — VITAMIN D 25 HYDROXY (VIT D DEFICIENCY, FRACTURES): VIT D 25 HYDROXY: 30 ng/mL (ref 30.0–100.0)

## 2015-06-12 LAB — TSH: TSH: 0.925 u[IU]/mL (ref 0.450–4.500)

## 2015-06-14 ENCOUNTER — Encounter: Payer: Self-pay | Admitting: Family Medicine

## 2015-06-14 DIAGNOSIS — E538 Deficiency of other specified B group vitamins: Secondary | ICD-10-CM | POA: Insufficient documentation

## 2015-06-14 DIAGNOSIS — E781 Pure hyperglyceridemia: Secondary | ICD-10-CM | POA: Insufficient documentation

## 2015-06-15 ENCOUNTER — Encounter: Payer: Self-pay | Admitting: Family Medicine

## 2015-06-15 NOTE — Telephone Encounter (Signed)
Routing to provider  

## 2015-06-15 NOTE — Telephone Encounter (Signed)
Amy, please get patient on the schedule for B12 shots and let her know how that process works The trail mix may have cause some elevation, so next time we draw cholesterol, we'll get it fasting Dr. Sherie DonLada

## 2015-06-15 NOTE — Telephone Encounter (Signed)
Dr. Sherie DonLada, she would like to just get the rx for the Vit B12 and syringes. 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

## 2015-06-16 MED ORDER — CYANOCOBALAMIN 1000 MCG/ML IJ SOLN: 1000.0000 ug | INTRAMUSCULAR | Status: DC

## 2015-06-16 MED ORDER — "SYRINGE/NEEDLE (DISP) 25G X 1"" 3 ML MISC": Status: DC

## 2015-06-16 NOTE — Telephone Encounter (Signed)
sent 

## 2015-09-10 ENCOUNTER — Ambulatory Visit: Payer: 59 | Admitting: Family Medicine

## 2015-10-13 ENCOUNTER — Telehealth: Payer: Self-pay | Admitting: Family Medicine

## 2015-10-13 NOTE — Telephone Encounter (Signed)
Routing to provider  

## 2015-10-13 NOTE — Telephone Encounter (Signed)
Patient no showed for her last appt Please let Jillene Buckshelsea M Bertha know that I'd like to see patient for an appointment here in the office for:   Please schedule a visit with me  in the next: month Fasting? Not necessary Thank you, Dr. Kayleen MemosLada I'll send Rx as requested

## 2015-10-14 NOTE — Telephone Encounter (Signed)
Patient schedule f/u appointment on 10/18/15

## 2015-10-18 ENCOUNTER — Ambulatory Visit (INDEPENDENT_AMBULATORY_CARE_PROVIDER_SITE_OTHER): Payer: 59 | Admitting: Family Medicine

## 2015-10-18 ENCOUNTER — Encounter: Payer: Self-pay | Admitting: Family Medicine

## 2015-10-18 VITALS — BP 127/85 | HR 93 | Temp 98.4°F | Ht 60.0 in | Wt 221.0 lb

## 2015-10-18 DIAGNOSIS — J351 Hypertrophy of tonsils: Secondary | ICD-10-CM | POA: Diagnosis not present

## 2015-10-18 DIAGNOSIS — R252 Cramp and spasm: Secondary | ICD-10-CM

## 2015-10-18 DIAGNOSIS — E669 Obesity, unspecified: Secondary | ICD-10-CM

## 2015-10-18 DIAGNOSIS — R0683 Snoring: Secondary | ICD-10-CM | POA: Diagnosis not present

## 2015-10-18 DIAGNOSIS — E282 Polycystic ovarian syndrome: Secondary | ICD-10-CM

## 2015-10-18 DIAGNOSIS — E538 Deficiency of other specified B group vitamins: Secondary | ICD-10-CM

## 2015-10-18 MED ORDER — SPIRONOLACTONE 25 MG PO TABS: 25.0000 mg | ORAL_TABLET | Freq: Every day | ORAL | Status: DC

## 2015-10-18 MED ORDER — METFORMIN HCL ER 500 MG PO TB24: 500.0000 mg | ORAL_TABLET | Freq: Every day | ORAL | Status: DC

## 2015-10-18 MED ORDER — NORGESTIM-ETH ESTRAD TRIPHASIC 0.18/0.215/0.25 MG-25 MCG PO TABS: 1.0000 | ORAL_TABLET | Freq: Every day | ORAL | Status: DC

## 2015-10-18 MED ORDER — CYANOCOBALAMIN 1000 MCG/ML IJ SOLN: 1000.0000 ug | INTRAMUSCULAR | Status: DC

## 2015-10-18 MED ORDER — CYANOCOBALAMIN 1000 MCG/ML IJ SOLN
1000.0000 ug | Freq: Once | INTRAMUSCULAR | Status: AC
  Administered 2015-10-18: 1000 ug via INTRAMUSCULAR

## 2015-10-18 MED ORDER — METFORMIN HCL ER 500 MG PO TB24: 1000.0000 mg | ORAL_TABLET | Freq: Every day | ORAL | Status: DC

## 2015-10-18 NOTE — Assessment & Plan Note (Addendum)
Increase metformin to 1000 mg daily; do NOT take metformin if sick or dehydrated; work on weight loss

## 2015-10-18 NOTE — Assessment & Plan Note (Signed)
Going to the gym; a struggle to lose weight; increase metformin; drink more water, 64 ounces a day

## 2015-10-18 NOTE — Patient Instructions (Addendum)
We'll give you B12 shots here once a month Okay to take extra B12 by mouth the last week or two before your shots Increase the metformin to 1000 mg daily Start aldactone Recheck labs next month when you come back for the next B12 shot to make sure potassium and kidneys are okay We'll refer you to the ear nose throat doctor Do elevate the head of your bed or sleep in recliner to help with breathing

## 2015-10-18 NOTE — Assessment & Plan Note (Addendum)
Now going to take it once a month; okay to take oral supplements the last 1-2 weeks of each month before shot runs out; we'll give shots here

## 2015-10-18 NOTE — Progress Notes (Signed)
BP 127/85 mmHg  Pulse 93  Temp(Src) 98.4 F (36.9 C)  Ht 5' (1.524 m)  Wt 221 lb (100.245 kg)  BMI 43.16 kg/m2  SpO2 97%  LMP 08/02/2015 (Approximate)   Subjective:    Patient ID: Kayla Hammond, female    DOB: 06/25/1991, 24 y.o.   MRN: [id number]  HPI: Kayla Hammond is a 24 y.o. female  Chief Complaint  Patient presents with  . PCOS    follow up  . B12    She has not been able to get the injections.  . Muscle Cramps    she would like her potassium level checked   She would like her potassium rechecked; she yawned and got a cramp in her neck; gets bad charley horses; started taking women's one a day multivitamin but she's not sure if it has potassium in it  She had low B12; she went to the pharmacy and got the first four shots, then no more; no refills; taking some OTC but not sure of the strength; she would like to just get the shots here  Needs refills of OCPs; no migraines; BP under fair control; no hx of DVTs or PEs; no hx of abnormal pap smear  She had a flu shot last Wednesday  More hair under the chin and around the breasts; irregular periods too  She has felt like her tonsils are swollen; started Thursday; choking on her food; no fever, no runny nose; has had maybe a cough a week ago; since her tonsils have been swelling, she has been snoring; waking up in the middle of the night sputtering for breath and coughing; neck circ 16"  Relevant past medical, surgical, family and social history reviewed and updated as indicated. Interim medical history since our last visit reviewed. Allergies and medications reviewed and updated.  Review of Systems  Per HPI unless specifically indicated above     Objective:    BP 127/85 mmHg  Pulse 93  Temp(Src) 98.4 F (36.9 C)  Ht 5' (1.524 m)  Wt 221 lb (100.245 kg)  BMI 43.16 kg/m2  SpO2 97%  LMP 08/02/2015 (Approximate)  Wt Readings from Last 3 Encounters:  10/18/15 221 lb (100.245 kg)  06/11/15 218 lb (98.884  kg)  05/06/14 206 lb (93.441 kg)    Physical Exam  Constitutional: She appears well-developed and well-nourished. No distress.  Morbidly obese  HENT:  Head: Normocephalic and atraumatic.  Nose: No rhinorrhea.  Mouth/Throat: Uvula is midline and mucous membranes are normal. No oral lesions. No uvula swelling. No oropharyngeal exudate, posterior oropharyngeal edema, posterior oropharyngeal erythema or tonsillar abscesses.  Tonsils 3+, not touching  Eyes: EOM are normal. No scleral icterus.  Neck: No thyromegaly present.  Neck circumference 16"  Cardiovascular: Normal rate, regular rhythm and normal heart sounds.   No murmur heard. Pulmonary/Chest: Effort normal and breath sounds normal. No respiratory distress. She has no wheezes.  Abdominal: Soft. She exhibits no distension.  Musculoskeletal: Normal range of motion. She exhibits no edema.  Neurological: She is alert. She exhibits normal muscle tone.  Skin: Skin is warm and dry. She is not diaphoretic. No pallor.  Psychiatric: She has a normal mood and affect. Her behavior is normal. Judgment and thought content normal.      Assessment & Plan:   Problem List Items Addressed This Visit      Respiratory   Tonsillar hypertrophy    With risk of OSA; refer ASAP to ENT for evaluation; no obstruction today  on exam; no erythema or exudate or fever to suggest infection      Relevant Orders   Ambulatory referral to ENT     Digestive   Vitamin B12 deficiency    Now going to take it once a month; okay to take oral supplements the last 1-2 weeks of each month before shot runs out; we'll give shots here      Relevant Medications   cyanocobalamin ((VITAMIN B-12)) injection 1,000 mcg (Completed)     Endocrine   PCOS (polycystic ovarian syndrome)    Increase metformin to 1000 mg daily; do NOT take metformin if sick or dehydrated; work on weight loss        Other   Obesity    Going to the gym; a struggle to lose weight; increase  metformin; drink more water, 64 ounces a day      Relevant Medications   metFORMIN (GLUCOPHAGE-XR) 500 MG 24 hr tablet   Snoring    Likely related to tonsillar hypertrophy; refer to ENT      Relevant Orders   Ambulatory referral to ENT   Cramps, muscle, general - Primary   Relevant Orders   Magnesium (Completed)   Basic metabolic panel (Completed)      Follow up plan: Return in about 1 month (around 11/17/2015) for next visit, B12 shot, labs, etc..  Orders Placed This Encounter  Procedures  . Magnesium  . Basic metabolic panel  . Please Note  . Ambulatory referral to ENT   An after-visit summary was printed and given to the patient at check-out.  Please see the patient instructions which may contain other information and recommendations beyond what is mentioned above in the assessment and plan.

## 2015-10-19 LAB — BASIC METABOLIC PANEL
BUN / CREAT RATIO: 20 (ref 8–20)
BUN: 14 mg/dL (ref 6–20)
CALCIUM: 9.6 mg/dL (ref 8.7–10.2)
CHLORIDE: 97 mmol/L (ref 97–106)
CO2: 23 mmol/L (ref 18–29)
Creatinine, Ser: 0.7 mg/dL (ref 0.57–1.00)
GFR calc Af Amer: 142 mL/min/{1.73_m2} (ref >59)
GFR, EST NON AFRICAN AMERICAN: 123 mL/min/{1.73_m2} (ref >59)
Glucose: 85 mg/dL (ref 65–99)
POTASSIUM: 4.4 mmol/L (ref 3.5–5.2)
SODIUM: 138 mmol/L (ref 136–144)

## 2015-10-19 LAB — MAGNESIUM: MAGNESIUM: 2 mg/dL (ref 1.6–2.3)

## 2015-10-19 LAB — PLEASE NOTE

## 2015-10-21 ENCOUNTER — Ambulatory Visit: Payer: 59

## 2015-10-21 ENCOUNTER — Telehealth: Payer: Self-pay | Admitting: Family Medicine

## 2015-10-21 DIAGNOSIS — J351 Hypertrophy of tonsils: Secondary | ICD-10-CM

## 2015-10-21 MED ORDER — PREDNISONE 20 MG PO TABS: ORAL_TABLET | ORAL | Status: DC

## 2015-10-21 NOTE — Telephone Encounter (Signed)
I talked to pt; she has not seen ENT yet; she doesn't go until the 7th; will start prednisone; check for strep (though I doubt)

## 2015-10-21 NOTE — Assessment & Plan Note (Signed)
Check rapid strep; start prednisone; appt with ENT was high priority, but they can't see her until the 7th

## 2015-10-21 NOTE — Assessment & Plan Note (Signed)
With risk of OSA; refer ASAP to ENT for evaluation; no obstruction today on exam; no erythema or exudate or fever to suggest infection

## 2015-10-21 NOTE — Assessment & Plan Note (Signed)
Likely related to tonsillar hypertrophy; refer to ENT

## 2015-10-25 ENCOUNTER — Telehealth: Payer: Self-pay | Admitting: Family Medicine

## 2015-10-25 LAB — RAPID STREP SCREEN (MED CTR MEBANE ONLY)

## 2015-10-25 NOTE — Telephone Encounter (Signed)
Please let patient know that her culture did not grow out Group A strep See how she's doing, if her tonsils are any better; if still significantly swollen, talk with one of the other providers there today to see how she's doing and if reasonable to treat with antibiotics even though not Group A If they do start antibiotics, have her take yogurt or probiotics to help lessen chance of C diff diarrhea

## 2015-10-25 NOTE — Telephone Encounter (Signed)
Patient notified, she is doing better. She was actually being seen at the ENT when I called her.

## 2015-11-17 ENCOUNTER — Ambulatory Visit: Payer: 59 | Admitting: Family Medicine

## 2015-11-22 ENCOUNTER — Ambulatory Visit: Payer: 59 | Admitting: Family Medicine

## 2015-11-23 ENCOUNTER — Ambulatory Visit: Payer: 59 | Admitting: Family Medicine

## 2016-01-19 ENCOUNTER — Ambulatory Visit: Payer: 59 | Admitting: Family Medicine

## 2016-01-24 ENCOUNTER — Ambulatory Visit: Payer: 59 | Admitting: Family Medicine

## 2016-01-26 ENCOUNTER — Encounter: Payer: Self-pay | Admitting: Family Medicine

## 2016-02-04 ENCOUNTER — Encounter: Payer: Self-pay | Admitting: Family Medicine

## 2016-02-04 ENCOUNTER — Telehealth: Payer: Self-pay | Admitting: Family Medicine

## 2016-02-04 ENCOUNTER — Ambulatory Visit (INDEPENDENT_AMBULATORY_CARE_PROVIDER_SITE_OTHER): Payer: 59 | Admitting: Family Medicine

## 2016-02-04 VITALS — BP 124/81 | HR 88 | Temp 97.1°F | Ht 60.5 in | Wt 230.4 lb

## 2016-02-04 DIAGNOSIS — A499 Bacterial infection, unspecified: Secondary | ICD-10-CM

## 2016-02-04 DIAGNOSIS — Z113 Encounter for screening for infections with a predominantly sexual mode of transmission: Secondary | ICD-10-CM

## 2016-02-04 DIAGNOSIS — Z124 Encounter for screening for malignant neoplasm of cervix: Secondary | ICD-10-CM | POA: Diagnosis not present

## 2016-02-04 DIAGNOSIS — Z7251 High risk heterosexual behavior: Secondary | ICD-10-CM

## 2016-02-04 DIAGNOSIS — N760 Acute vaginitis: Secondary | ICD-10-CM | POA: Diagnosis not present

## 2016-02-04 DIAGNOSIS — B9689 Other specified bacterial agents as the cause of diseases classified elsewhere: Secondary | ICD-10-CM

## 2016-02-04 DIAGNOSIS — J351 Hypertrophy of tonsils: Secondary | ICD-10-CM | POA: Diagnosis not present

## 2016-02-04 DIAGNOSIS — E538 Deficiency of other specified B group vitamins: Secondary | ICD-10-CM

## 2016-02-04 LAB — WET PREP FOR TRICH, YEAST, CLUE
CLUE CELL EXAM: POSITIVE — AB
TRICHOMONAS EXAM: NEGATIVE
YEAST EXAM: NEGATIVE

## 2016-02-04 LAB — PREGNANCY, URINE: PREG TEST UR: NEGATIVE

## 2016-02-04 MED ORDER — CYANOCOBALAMIN 1000 MCG/ML IJ SOLN
1000.0000 ug | Freq: Once | INTRAMUSCULAR | Status: AC
  Administered 2016-02-04: 1000 ug via INTRAMUSCULAR

## 2016-02-04 MED ORDER — METRONIDAZOLE 500 MG PO TABS: 500.0000 mg | ORAL_TABLET | Freq: Two times a day (BID) | ORAL | Status: DC

## 2016-02-04 NOTE — Patient Instructions (Addendum)
We'll let you know about your lab results If you have not heard anything from my staff in a week about any orders/referrals/studies from today, please contact us here to follow-up [phone]  Check out the information at familydoctor.org entitled "What It Takes to Lose Weight" Try to lose between 1-2 pounds per week by taking in fewer calories and burning off more calories You can succeed by limiting portions, limiting foods dense in calories and fat, becoming more active, and drinking 8 glasses of water a day (64 ounces) Don't skip meals, especially breakfast, as skipping meals may alter your metabolism Do not use over-the-counter weight loss pills or gimmicks that claim rapid weight loss A healthy BMI (or body mass index) is between 18.5 and 24.9 You can calculate your ideal BMI at the NIH website [employer]   Safe Sex Safe sex is about reducing the risk of giving or getting a sexually transmitted disease (STD). STDs are spread through sexual contact involving the genitals, mouth, or rectum. Some STDs can be cured and others cannot. Safe sex can also prevent unintended pregnancies.  WHAT ARE SOME SAFE SEX PRACTICES?  Limit your sexual activity to only one partner who is having sex with only you.  Talk to your partner about his or her past partners, past STDs, and drug use.  Use a condom every time you have sexual intercourse. This includes vaginal, oral, and anal sexual activity. Both females and males should wear condoms during oral sex. Only use latex or polyurethane condoms and water-based lubricants. Using petroleum-based lubricants or oils to lubricate a condom will weaken the condom and increase the chance that it will break. The condom should be in place from the beginning to the end of sexual activity. Wearing a condom reduces, but does not completely eliminate, your risk of getting or giving an STD. STDs can be spread by  contact with infected body fluids and skin.  Get vaccinated for hepatitis B and HPV.  Avoid alcohol and recreational drugs, which can affect your judgment. You may forget to use a condom or participate in high-risk sex.  For females, avoid douching after sexual intercourse. Douching can spread an infection farther into the reproductive tract.  Check your body for signs of sores, blisters, rashes, or unusual discharge. See your health care provider if you notice any of these signs.  Avoid sexual contact if you have symptoms of an infection or are being treated for an STD. If you or your partner has herpes, avoid sexual contact when blisters are present. Use condoms at all other times.  If you are at risk of being infected with HIV, it is recommended that you take a prescription medicine daily to prevent HIV infection. This is called pre-exposure prophylaxis (PrEP). You are considered at risk if:  You are a man who has sex with other men (MSM).  You are a heterosexual man or woman who is sexually active with more than one partner.  You take drugs by injection.  You are sexually active with a partner who has HIV.  Talk with your health care provider about whether you are at high risk of being infected with HIV. If you choose to begin PrEP, you should first be tested for HIV. You should then be tested every 3 months for as long as you are taking PrEP.  See your health care provider for regular screenings, exams, and tests for other STDs. Before having sex with a new partner, each of you should be screened  for STDs and should talk about the results with each other. WHAT ARE THE BENEFITS OF SAFE SEX?   There is less chance of getting or giving an STD.  You can prevent unwanted or unintended pregnancies.  By discussing safe sex concerns with your partner, you may increase feelings of intimacy, comfort, trust, and honesty between the two of you.   This information is not intended to replace  advice given to you by your health care provider. Make sure you discuss any questions you have with your health care provider.   Document Released: 01/11/2005 Document Revised: 12/25/2014 Document Reviewed: 05/27/2012 Elsevier Interactive Patient Education [employer]

## 2016-02-04 NOTE — Progress Notes (Signed)
BP 124/81 mmHg  Pulse 88  Temp(Src) 97.1 F (36.2 C)  Ht 5' 0.5" (1.537 m)  Wt 230 lb 6.4 oz (104.509 kg)  BMI 44.24 kg/m2  SpO2 98%  LMP 01/04/2016 (Approximate)   Subjective:    Patient ID: Kayla Hammond, female    DOB: May 16, 1991, 25 y.o.   MRN: [id number]  HPI: Kayla Hammond is a 25 y.o. female  Chief Complaint  Patient presents with  . Exposure to STD    Got a new partner within the last 5 months and wants to be screened.   . B12 Injection    Needs B12 injection as well.    Never had pap smear before; asked if we could do this while we check her for STDs; she has no symptoms, just wants to be smart and know  She has had a lot going on; had strep throat, tonsils already stay swollen; the infection is gone; she has already seen ENT about having tonsillectomy  She has been meal replacement shakes; protein powders, two per day and one healthy meal Going to the gym five days a week; lost two pounds this past week; trying to lose weight, morbidly obese  She would like a B12 injection; has not had one in a while; supposed to get getting them every month  Relevant past medical and social history reviewed and updated as indicated. Interim medical history since our last visit reviewed. Allergies and medications reviewed and updated.  Review of Systems Denies sx of STDs Per HPI unless specifically indicated above     Objective:    BP 124/81 mmHg  Pulse 88  Temp(Src) 97.1 F (36.2 C)  Ht 5' 0.5" (1.537 m)  Wt 230 lb 6.4 oz (104.509 kg)  BMI 44.24 kg/m2  SpO2 98%  LMP 01/04/2016 (Approximate)  Wt Readings from Last 3 Encounters:  02/04/16 230 lb 6.4 oz (104.509 kg)  10/18/15 221 lb (100.245 kg)  06/11/15 218 lb (98.884 kg)    Physical Exam  Constitutional: She appears well-developed and well-nourished. No distress.  Morbidly obese; weight gain 9 pounds over last 3-1/2 months  Cardiovascular: Normal rate.   Pulmonary/Chest: Effort normal.  Abdominal: She  exhibits no distension.  Genitourinary: Uterus is not enlarged and not tender. Cervix exhibits no motion tenderness, no discharge and no friability. Right adnexum displays no mass, no tenderness and no fullness. Left adnexum displays no mass, no tenderness and no fullness. No erythema or tenderness in the vagina. No signs of injury around the vagina. Vaginal discharge (scant thin discharge; very minimal bloody discharge at fornix c/w onset of menses) found.  Skin: No rash noted.  Psychiatric: She has a normal mood and affect.   WET PREP FOR TRICH, YEAST, CLUE  Result Value Ref Range   Trichomonas Exam Negative Negative   Yeast Exam Negative Negative   Clue Cell Exam Positive (A) Negative      Assessment & Plan:   Problem List Items Addressed This Visit      Respiratory   Tonsillar hypertrophy    She has been to ENT, considering tonsillectomy        Digestive   Vitamin B12 deficiency    B12 injections monhtly, 1000 mcg given IM today      Relevant Medications   cyanocobalamin ((VITAMIN B-12)) injection 1,000 mcg (Completed)     Other   Morbid obesity (HCC)    Encouragement given for patient to work on healthy weight loss, reduced calories in and increased  calories out (exercise)      Encounter for screening for cervical cancer     Pap smear collected today      Relevant Orders   Pap IG, CT/NG w/ reflex HPV when ASC-U    Other Visit Diagnoses    Screen for STD (sexually transmitted disease)    -  Primary    testing done today; glad patient wants to be proactive and smart; see AVS    Relevant Orders    HIV antibody (Completed)    WET PREP FOR TRICH, YEAST, CLUE    GC/Chlamydia Probe Amp    RPR (Completed)    HSV(herpes simplex vrs) 1+2 ab-IgG (Completed)    Hepatitis panel, acute (Completed)    Unprotected sex        testing for STDs, pregnancy today; see AVS    Relevant Orders    Pregnancy, urine (Completed)    Bacterial vaginosis        patient called after lab  returned; explained dx, rx sent to pharmacy        Follow up plan: Return in about 1 month (around 03/03/2016) for b12 shot x 3.  Orders Placed This Encounter  Procedures  . WET PREP FOR TRICH, YEAST, CLUE  . GC/Chlamydia Probe Amp  . WET PREP FOR TRICH, YEAST, CLUE  . HIV antibody  . RPR  . HSV(herpes simplex vrs) 1+2 ab-IgG  . Hepatitis panel, acute  . Pregnancy, urine   An after-visit summary was printed and given to the patient at check-out.  Please see the patient instructions which may contain other information and recommendations beyond what is mentioned above in the assessment and plan.

## 2016-02-04 NOTE — Telephone Encounter (Signed)
I explained the dx; no worry; start medicine; [facility] requested

## 2016-02-05 LAB — HEPATITIS PANEL, ACUTE
HEP B S AG: NEGATIVE
Hep A IgM: NEGATIVE
Hep B C IgM: NEGATIVE
Hep C Virus Ab: <0.1 s/co ratio (ref 0.0–0.9)

## 2016-02-05 LAB — RPR: RPR Ser Ql: NONREACTIVE

## 2016-02-05 LAB — HIV ANTIBODY (ROUTINE TESTING W REFLEX): HIV Screen 4th Generation wRfx: NONREACTIVE

## 2016-02-05 LAB — HSV(HERPES SIMPLEX VRS) I + II AB-IGG: HSV 1 Glycoprotein G Ab, IgG: <0.91 index (ref 0.00–0.90)

## 2016-02-05 NOTE — Assessment & Plan Note (Signed)
Pap smear collected today 

## 2016-02-05 NOTE — Assessment & Plan Note (Signed)
She has been to ENT, considering tonsillectomy

## 2016-02-05 NOTE — Assessment & Plan Note (Signed)
Encouragement given for patient to work on healthy weight loss, reduced calories in and increased calories out (exercise)

## 2016-02-05 NOTE — Assessment & Plan Note (Signed)
B12 injections monhtly, 1000 mcg given IM today

## 2016-02-07 LAB — PAP IG, CT-NG, RFX HPV ASCU
CHLAMYDIA, NUC. ACID AMP: NEGATIVE
Gonococcus by Nucleic Acid Amp: NEGATIVE
PAP SMEAR COMMENT: 0

## 2016-02-07 LAB — GC/CHLAMYDIA PROBE AMP
Chlamydia trachomatis, NAA: NEGATIVE
Neisseria gonorrhoeae by PCR: NEGATIVE

## 2016-03-03 ENCOUNTER — Other Ambulatory Visit: Payer: Self-pay | Admitting: Family Medicine

## 2016-03-03 ENCOUNTER — Ambulatory Visit (INDEPENDENT_AMBULATORY_CARE_PROVIDER_SITE_OTHER): Payer: 59

## 2016-03-03 DIAGNOSIS — E538 Deficiency of other specified B group vitamins: Secondary | ICD-10-CM

## 2016-03-03 MED ORDER — CYANOCOBALAMIN 1000 MCG/ML IJ SOLN
1000.0000 ug | Freq: Once | INTRAMUSCULAR | Status: AC
  Administered 2016-03-03: 1000 ug via INTRAMUSCULAR

## 2016-03-03 NOTE — Assessment & Plan Note (Signed)
IM today 

## 2016-03-10 ENCOUNTER — Telehealth: Payer: Self-pay | Admitting: Family Medicine

## 2016-03-10 NOTE — Telephone Encounter (Signed)
Pt called looking for "physical form" she needed filled out.  Please call pt regarding this.

## 2016-03-13 NOTE — Telephone Encounter (Signed)
I spoke with patient, notified her that we just needed to complete the vision/hearing, u/a and hemoglobin on her paperwork and then Dr. Sherie DonLada could sign it. She will come by Wednesday afternoon to get that done.

## 2016-03-15 ENCOUNTER — Ambulatory Visit: Payer: 59

## 2016-03-15 ENCOUNTER — Telehealth: Payer: Self-pay | Admitting: Family Medicine

## 2016-03-15 DIAGNOSIS — Z139 Encounter for screening, unspecified: Secondary | ICD-10-CM

## 2016-03-15 LAB — MICROSCOPIC EXAMINATION

## 2016-03-15 NOTE — Telephone Encounter (Signed)
I talked with pt about mildly elev WBC and urine; she does not think she has a bladder infection; has some URI symptoms; she opted to wait on the urine culture instead of starting ABX

## 2016-03-15 NOTE — Progress Notes (Signed)
Patient came in for vision, hearing, urine, h/h to have her college physical form completed.

## 2016-03-17 LAB — CBC WITH DIFFERENTIAL/PLATELET
HEMATOCRIT: 39.4 % (ref 34.0–46.6)
Hemoglobin: 13.6 g/dL (ref 11.1–15.9)
LYMPHS ABS: 3.1 10*3/uL (ref 0.7–3.1)
Lymphs: 26 %
MCH: 28.4 pg (ref 26.6–33.0)
MCHC: 34.5 g/dL (ref 31.5–35.7)
MCV: 82 fL (ref 79–97)
MID (ABSOLUTE): 0.8 10*3/uL (ref 0.1–1.6)
MID: 7 %
Neutrophils Absolute: 7.9 10*3/uL — ABNORMAL HIGH (ref 1.4–7.0)
Neutrophils: 67 %
PLATELETS: 308 10*3/uL (ref 150–379)
RBC: 4.79 x10E6/uL (ref 3.77–5.28)
RDW: 12.9 % (ref 12.3–15.4)
WBC: 11.8 10*3/uL — ABNORMAL HIGH (ref 3.4–10.8)

## 2016-03-17 LAB — UA/M W/RFLX CULTURE, ROUTINE
BILIRUBIN UA: NEGATIVE
Glucose, UA: NEGATIVE
KETONES UA: NEGATIVE
Nitrite, UA: NEGATIVE
PROTEIN UA: NEGATIVE
RBC UA: NEGATIVE
SPEC GRAV UA: 1.015 (ref 1.005–1.030)
UUROB: 0.2 mg/dL (ref 0.2–1.0)
pH, UA: 6.5 (ref 5.0–7.5)

## 2016-03-17 LAB — URINE CULTURE, REFLEX

## 2016-06-12 ENCOUNTER — Encounter: Payer: 59 | Admitting: Family Medicine

## 2016-10-28 ENCOUNTER — Encounter: Payer: Self-pay | Admitting: Family Medicine

## 2016-11-07 ENCOUNTER — Encounter: Payer: Self-pay | Admitting: Family Medicine

## 2016-11-07 ENCOUNTER — Ambulatory Visit (INDEPENDENT_AMBULATORY_CARE_PROVIDER_SITE_OTHER): Payer: BC Managed Care – PPO | Admitting: Family Medicine

## 2016-11-07 DIAGNOSIS — Z5181 Encounter for therapeutic drug level monitoring: Secondary | ICD-10-CM

## 2016-11-07 DIAGNOSIS — E282 Polycystic ovarian syndrome: Secondary | ICD-10-CM | POA: Diagnosis not present

## 2016-11-07 DIAGNOSIS — Z113 Encounter for screening for infections with a predominantly sexual mode of transmission: Secondary | ICD-10-CM | POA: Insufficient documentation

## 2016-11-07 DIAGNOSIS — E538 Deficiency of other specified B group vitamins: Secondary | ICD-10-CM

## 2016-11-07 DIAGNOSIS — E781 Pure hyperglyceridemia: Secondary | ICD-10-CM | POA: Diagnosis not present

## 2016-11-07 MED ORDER — CLINDAMYCIN PHOSPHATE 1 % EX LOTN: TOPICAL_LOTION | Freq: Two times a day (BID) | CUTANEOUS | 5 refills | Status: DC

## 2016-11-07 MED ORDER — METFORMIN HCL ER 500 MG PO TB24: 500.0000 mg | ORAL_TABLET | Freq: Every day | ORAL | 3 refills | Status: DC

## 2016-11-07 MED ORDER — NORGESTIM-ETH ESTRAD TRIPHASIC 0.18/0.215/0.25 MG-25 MCG PO TABS: 1.0000 | ORAL_TABLET | Freq: Every day | ORAL | 3 refills | Status: DC

## 2016-11-07 NOTE — Progress Notes (Signed)
BP 102/60   Pulse 88   Temp 98.8 F (37.1 C) (Oral)   Resp 14   Wt 219 lb (99.3 kg)   LMP 09/30/2016   SpO2 96%   BMI 42.07 kg/m    Subjective:    Patient ID: Kayla Hammond, female    DOB: 05-12-1991, 25 y.o.   MRN: [id number]  HPI: Kayla Hammond is a 25 y.o. female  Chief Complaint  Patient presents with  . Contraception    Has PCOS wants to talk about getting on pill and also discuss metformin  . STD check    She would like STD checking; no symptoms; previous partner was unfaithful; has had sores in the genital area  Some bumps breaking around the mouth; works in health care; started working out recently; sweating more; started 3 weeks ago; harder the past week; not showering immediately  Would like to get on the metformin for her PCOS; had some GI symptoms with the short-acting Her GYN wanted to do 3 hour GTT; dark patch on the back of the neck; insulin resistance  OCP requested; no migraines; no hx of DVT or PE; no hx of heart attack; BP controlled; nonsmoker  [facility] 11/07/2016 06/11/2015  Decreased Interest 0 0  Down, Depressed, Hopeless 0 0  PHQ - 2 Score 0 0   Relevant past medical, surgical, family and social history reviewed Past Medical History:  Diagnosis Date  . Iron deficiency anemia   . Obesity   . Polycystic ovarian disease   . Psoriasis    History reviewed. No pertinent surgical history. Family History  Problem Relation Age of Onset  . Hypertension Mother   . Hypertension Father   . Alcohol abuse Sister   . Drug abuse Sister   . Seizures Sister   . Cancer Maternal Grandmother     colon  . Diabetes Paternal Grandmother   . Heart disease Paternal Grandfather   . Stroke Neg Hx   . COPD Neg Hx    Social History  Substance Use Topics  . Smoking status: Never Smoker  . Smokeless tobacco: Never Used  . Alcohol use Yes     Comment: socially   Interim medical history since last visit reviewed. Allergies and  medications reviewed  Review of Systems Per HPI unless specifically indicated above     Objective:    BP 102/60   Pulse 88   Temp 98.8 F (37.1 C) (Oral)   Resp 14   Wt 219 lb (99.3 kg)   LMP 09/30/2016   SpO2 96%   BMI 42.07 kg/m   Wt Readings from Last 3 Encounters:  11/07/16 219 lb (99.3 kg)  02/04/16 230 lb 6.4 oz (104.5 kg)  10/18/15 221 lb (100.2 kg)    Physical Exam  Constitutional: She appears well-developed and well-nourished. No distress.  Morbidly obese; weight loss 11+ pounds over last 9 months  HENT:  Mouth/Throat: Mucous membranes are normal.  Neck: No thyromegaly present.  Cardiovascular: Normal rate and regular rhythm.   Pulmonary/Chest: Effort normal and breath sounds normal. No respiratory distress.  Abdominal: She exhibits no distension.  Neurological: She is alert. She displays no tremor.  Skin: No rash noted.  Mild hyperpigmentation over nape of neck c/w mild acanthosis nigricans; acne on the face  Psychiatric: She has a normal mood and affect.    Results for orders placed or performed in visit on 11/07/16  GC/Chlamydia Probe Amp  Result Value Ref Range  CT Probe RNA NOT DETECTED    GC Probe RNA NOT DETECTED       Assessment & Plan:   Problem List Items Addressed This Visit      Endocrine   PCOS (polycystic ovarian syndrome)    Check A1c, glucose, insulin      Relevant Medications   Norgestimate-Ethinyl Estradiol Triphasic (ORTHO TRI-CYCLEN LO) 0.18/0.215/0.25 MG-25 MCG tab   Other Relevant Orders   Hemoglobin A1c   Insulin, random     Other   Vitamin B12 deficiency    Check level      Relevant Orders   Vitamin B12   Screen for STD (sexually transmitted disease)    Screen for stds; safe sex encouraged, condom use      Relevant Orders   RPR   Hepatitis panel, acute   HIV antibody   GC/Chlamydia Probe Amp (Completed)   Morbid obesity (HCC)    Praise given for losing weight, exercising      Relevant Medications    metFORMIN (GLUCOPHAGE XR) 500 MG 24 hr tablet   Other Relevant Orders   Lipid panel   Hemoglobin A1c   Medication monitoring encounter    Check creatinine      Relevant Orders   COMPLETE METABOLIC PANEL WITH GFR   Hypertriglyceridemia    Check labs      Relevant Orders   Lipid panel       Follow up plan: Return in about 3 months (around 02/07/2017) for follow-up.  An after-visit summary was printed and given to the patient at check-out.  Please see the patient instructions which may contain other information and recommendations beyond what is mentioned above in the assessment and plan.  Meds ordered this encounter  Medications  . clindamycin (CLEOCIN T) 1 % lotion    Sig: Apply topically 2 (two) times daily.    Dispense:  60 mL    Refill:  5  . metFORMIN (GLUCOPHAGE XR) 500 MG 24 hr tablet    Sig: Take 1 tablet (500 mg total) by mouth daily with breakfast.    Dispense:  30 tablet    Refill:  3  . Norgestimate-Ethinyl Estradiol Triphasic (ORTHO TRI-CYCLEN LO) 0.18/0.215/0.25 MG-25 MCG tab    Sig: Take 1 tablet by mouth daily.    Dispense:  1 Package    Refill:  3    Orders Placed This Encounter  Procedures  . GC/Chlamydia Probe Amp  . RPR  . Hepatitis panel, acute  . HIV antibody  . Lipid panel  . Hemoglobin A1c  . COMPLETE METABOLIC PANEL WITH GFR  . Vitamin B12  . Insulin, random

## 2016-11-07 NOTE — Assessment & Plan Note (Signed)
Check labs 

## 2016-11-07 NOTE — Patient Instructions (Signed)
Return next week for fasting labs Start the birth control pill on the Sunday after your next period starts Start the metformin Keep up the great job with weight loss and exercise

## 2016-11-07 NOTE — Assessment & Plan Note (Signed)
Screen for stds; safe sex encouraged, condom use

## 2016-11-07 NOTE — Assessment & Plan Note (Signed)
Praise given for losing weight, exercising

## 2016-11-07 NOTE — Assessment & Plan Note (Signed)
Check A1c, glucose, insulin

## 2016-11-07 NOTE — Assessment & Plan Note (Signed)
Check level 

## 2016-11-08 LAB — GC/CHLAMYDIA PROBE AMP
CT Probe RNA: NOT DETECTED
GC Probe RNA: NOT DETECTED

## 2016-11-12 DIAGNOSIS — Z5181 Encounter for therapeutic drug level monitoring: Secondary | ICD-10-CM | POA: Insufficient documentation

## 2016-11-12 NOTE — Assessment & Plan Note (Signed)
Check creatinine 

## 2016-11-13 ENCOUNTER — Other Ambulatory Visit: Payer: Self-pay | Admitting: Family Medicine

## 2016-11-13 ENCOUNTER — Other Ambulatory Visit: Payer: Self-pay

## 2016-11-13 DIAGNOSIS — Z5181 Encounter for therapeutic drug level monitoring: Secondary | ICD-10-CM

## 2016-11-13 DIAGNOSIS — E538 Deficiency of other specified B group vitamins: Secondary | ICD-10-CM

## 2016-11-13 DIAGNOSIS — E282 Polycystic ovarian syndrome: Secondary | ICD-10-CM

## 2016-11-13 DIAGNOSIS — E781 Pure hyperglyceridemia: Secondary | ICD-10-CM

## 2016-11-13 LAB — COMPLETE METABOLIC PANEL WITH GFR
ALT: 19 U/L (ref 6–29)
AST: 19 U/L (ref 10–30)
Albumin: 4.5 g/dL (ref 3.6–5.1)
Alkaline Phosphatase: 80 U/L (ref 33–115)
BUN: 18 mg/dL (ref 7–25)
CHLORIDE: 103 mmol/L (ref 98–110)
CO2: 27 mmol/L (ref 20–31)
CREATININE: 0.94 mg/dL (ref 0.50–1.10)
Calcium: 9.4 mg/dL (ref 8.6–10.2)
GFR, Est African American: >89 mL/min (ref >=60)
GFR, Est Non African American: 86 mL/min (ref >=60)
Glucose, Bld: 92 mg/dL (ref 65–99)
Potassium: 4.3 mmol/L (ref 3.5–5.3)
Sodium: 139 mmol/L (ref 135–146)
Total Bilirubin: 0.7 mg/dL (ref 0.2–1.2)
Total Protein: 7.5 g/dL (ref 6.1–8.1)

## 2016-11-13 LAB — LIPID PANEL
Cholesterol: 171 mg/dL (ref <200)
HDL: 33 mg/dL — AB (ref >50)
LDL CALC: 92 mg/dL (ref <100)
TRIGLYCERIDES: 232 mg/dL — AB (ref <150)
Total CHOL/HDL Ratio: 5.2 Ratio — ABNORMAL HIGH (ref <5.0)
VLDL: 46 mg/dL — AB (ref <30)

## 2016-11-13 LAB — HEMOGLOBIN A1C
HEMOGLOBIN A1C: 5.3 % (ref <5.7)
MEAN PLASMA GLUCOSE: 105 mg/dL

## 2016-11-14 LAB — VITAMIN B12: Vitamin B-12: 335 pg/mL (ref 200–1100)

## 2016-11-14 LAB — INSULIN, RANDOM: INSULIN: 22.8 u[IU]/mL — AB (ref 2.0–19.6)

## 2016-11-15 LAB — HEPATITIS PANEL, ACUTE
HCV Ab: NEGATIVE
HEP A IGM: NONREACTIVE
HEP B C IGM: NONREACTIVE
HEP B S AG: NEGATIVE

## 2016-11-15 LAB — HIV ANTIBODY (ROUTINE TESTING W REFLEX): HIV: NONREACTIVE

## 2016-11-16 ENCOUNTER — Encounter: Payer: Self-pay | Admitting: Family Medicine

## 2016-11-16 LAB — RPR

## 2016-12-24 ENCOUNTER — Encounter: Payer: Self-pay | Admitting: Family Medicine

## 2016-12-25 MED ORDER — METFORMIN HCL ER 500 MG PO TB24: 1000.0000 mg | ORAL_TABLET | Freq: Every day | ORAL | 3 refills | Status: DC

## 2017-01-05 ENCOUNTER — Encounter: Payer: Self-pay | Admitting: Family Medicine

## 2017-02-06 ENCOUNTER — Encounter: Payer: Self-pay | Admitting: Family Medicine

## 2017-02-06 ENCOUNTER — Ambulatory Visit (INDEPENDENT_AMBULATORY_CARE_PROVIDER_SITE_OTHER): Payer: BC Managed Care – PPO | Admitting: Family Medicine

## 2017-02-06 VITALS — BP 102/64 | HR 80 | Temp 98.6°F | Resp 16 | Wt 214.0 lb

## 2017-02-06 DIAGNOSIS — E781 Pure hyperglyceridemia: Secondary | ICD-10-CM

## 2017-02-06 DIAGNOSIS — E161 Other hypoglycemia: Secondary | ICD-10-CM | POA: Diagnosis not present

## 2017-02-06 DIAGNOSIS — R198 Other specified symptoms and signs involving the digestive system and abdomen: Secondary | ICD-10-CM | POA: Diagnosis not present

## 2017-02-06 DIAGNOSIS — E538 Deficiency of other specified B group vitamins: Secondary | ICD-10-CM | POA: Diagnosis not present

## 2017-02-06 DIAGNOSIS — E282 Polycystic ovarian syndrome: Secondary | ICD-10-CM | POA: Diagnosis not present
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
## 2017-02-06 NOTE — Assessment & Plan Note (Signed)
She is working on weight loss and will try to get back into the gym; metformin may help with hyperinsulinemia; can also consider Saxenda or other agent in the future

## 2017-02-06 NOTE — Assessment & Plan Note (Signed)
Improving, continue supplementation; discussed causes, including poor GI absorption, and that metformin may affect this; 500 to 1000 mcg daily recommended

## 2017-02-06 NOTE — Assessment & Plan Note (Signed)
Check A1c and fasting insulin next week; hope that the metformin and weight loss are helping

## 2017-02-06 NOTE — Assessment & Plan Note (Signed)
Continue the metformin; work on weight loss; with low BP, not in favor of starting spironolactone

## 2017-02-06 NOTE — Assessment & Plan Note (Signed)
Check fasting labs next week

## 2017-02-06 NOTE — Patient Instructions (Signed)
Start back taking vitamin B12 500 or 1000 mcg daily Return next Wednesday or just after for fasting insulin and cholesterol Keep up the great job with working on your Ray[employer]weight

## 2017-03-02 ENCOUNTER — Other Ambulatory Visit: Payer: Self-pay | Admitting: Family Medicine

## 2017-03-02 DIAGNOSIS — E282 Polycystic ovarian syndrome: Secondary | ICD-10-CM

## 2017-03-02 NOTE — Telephone Encounter (Signed)
No migraines; last pap smear UTD: Rx approved

## 2017-03-27 ENCOUNTER — Other Ambulatory Visit: Payer: Self-pay | Admitting: Family Medicine

## 2017-03-27 LAB — LIPID PANEL
CHOLESTEROL: 184 mg/dL (ref <200)
HDL: 44 mg/dL — ABNORMAL LOW (ref >50)
LDL Cholesterol: 100 mg/dL — ABNORMAL HIGH (ref <100)
TRIGLYCERIDES: 200 mg/dL — AB (ref <150)
Total CHOL/HDL Ratio: 4.2 Ratio (ref <5.0)
VLDL: 40 mg/dL — AB (ref <30)

## 2017-03-31 LAB — INSULIN ANTIBODIES, BLOOD: Insulin Antibodies, Human: <0.4 U/mL (ref <0.4)

## 2017-04-04 ENCOUNTER — Encounter: Payer: Self-pay | Admitting: Family Medicine

## 2017-04-05 ENCOUNTER — Other Ambulatory Visit: Payer: Self-pay

## 2017-04-05 DIAGNOSIS — E161 Other hypoglycemia: Secondary | ICD-10-CM

## 2017-04-05 NOTE — Telephone Encounter (Signed)
Amber, please order the free and total insulin levels that I ordered at the time of patient's last appt; see note; she's coming in tomorrow or next week; thank you  ------------------------------  Orders Placed This Encounter  Procedures  . Insulin-free, total and bound), blood

## 2017-04-16 ENCOUNTER — Encounter: Payer: Self-pay | Admitting: Family Medicine

## 2017-04-29 ENCOUNTER — Other Ambulatory Visit: Payer: Self-pay | Admitting: Family Medicine

## 2017-08-13 ENCOUNTER — Other Ambulatory Visit: Payer: Self-pay | Admitting: Family Medicine

## 2017-08-13 DIAGNOSIS — E282 Polycystic ovarian syndrome: Secondary | ICD-10-CM

## 2017-08-13 NOTE — Telephone Encounter (Signed)
No HTN, no migraine with aura Rx approved

## 2018-01-13 ENCOUNTER — Other Ambulatory Visit: Payer: Self-pay | Admitting: Family Medicine

## 2018-01-14 NOTE — Telephone Encounter (Signed)
Please contact patient, ask her to schedule an appointment in the next few weeks We'll get fasting labs, so morning appt preferred please I've sent refill; thank you

## 2018-01-22 ENCOUNTER — Encounter: Payer: Self-pay | Admitting: Family Medicine

## 2018-01-29 ENCOUNTER — Ambulatory Visit: Payer: BC Managed Care – PPO | Admitting: Family Medicine

## 2018-02-05 ENCOUNTER — Other Ambulatory Visit: Payer: Self-pay

## 2018-02-05 NOTE — Telephone Encounter (Signed)
90 day supply

## 2018-02-05 NOTE — Telephone Encounter (Signed)
No refill of the metformin until patient is seen See last e-mail I don't see that patient was evaluated (no labs or UC or ER visit)

## 2018-02-06 ENCOUNTER — Other Ambulatory Visit: Payer: Self-pay

## 2018-02-06 NOTE — Telephone Encounter (Signed)
90 day has an appt on friday

## 2018-02-08 ENCOUNTER — Ambulatory Visit: Payer: BC Managed Care – PPO | Admitting: Family Medicine

## 2018-02-08 ENCOUNTER — Encounter: Payer: Self-pay | Admitting: Family Medicine

## 2018-02-08 VITALS — BP 110/68 | HR 89 | Temp 98.1°F | Ht 60.0 in | Wt 216.4 lb

## 2018-02-08 DIAGNOSIS — R5383 Other fatigue: Secondary | ICD-10-CM | POA: Diagnosis not present

## 2018-02-08 DIAGNOSIS — E282 Polycystic ovarian syndrome: Secondary | ICD-10-CM

## 2018-02-08 DIAGNOSIS — E538 Deficiency of other specified B group vitamins: Secondary | ICD-10-CM | POA: Diagnosis not present

## 2018-02-08 DIAGNOSIS — E161 Other hypoglycemia: Secondary | ICD-10-CM | POA: Diagnosis not present

## 2018-02-08 MED ORDER — METFORMIN HCL ER 500 MG PO TB24: 1500.0000 mg | ORAL_TABLET | Freq: Every day | ORAL | 1 refills | Status: AC

## 2018-02-08 NOTE — Assessment & Plan Note (Signed)
Check level today; continue metformin

## 2018-02-08 NOTE — Progress Notes (Signed)
BP 110/68 (BP Location: Left Arm, Patient Position: Sitting, Cuff Size: Large)   Pulse 89   Temp 98.1 F (36.7 C) (Oral)   Ht 5' (1.524 m)   Wt 216 lb 6.4 oz (98.2 kg)   LMP 01/21/2018   SpO2 99%   BMI 42.26 kg/m    Subjective:    Patient ID: Kayla Hammond, female    DOB: 1991-07-05, 27 y.o.   MRN: [id number]  HPI: Kayla Hammond is a [age] y.o. female  Chief Complaint  Patient presents with  . Medication Refill    HPI She is here for follow-up She is going to [facility] She had hair loss and dermatologist restarted the spirinolactone; she would like a potassium checked; she has been eating a lot of tangerines and spinach Eats oatmeal three times a day Energy level better on the B12; was as low as 166 2 years ago; not sure if anyone in the family takes shots; could be undiagnosed just not getting it checked Irregular period; usually longer than two days; not heavy periods, no clots Hair growth under the chin; some acne, not major; not sure if PCOS or not No residual stomach issues whatsoever  [facility] 02/08/2018 02/06/2017 11/07/2016 06/11/2015  Decreased Interest 0 0 0 0  Down, Depressed, Hopeless 0 0 0 0  PHQ - 2 Score 0 0 0 0    Relevant past medical, surgical, family and social history reviewed Past Medical History:  Diagnosis Date  . Iron deficiency anemia   . Obesity   . Polycystic ovarian disease   . Psoriasis    History reviewed. No pertinent surgical history. Family History  Problem Relation Age of Onset  . Hypertension Mother   . Hypertension Father   . Alcohol abuse Sister   . Drug abuse Sister   . Seizures Sister   . Cancer Maternal Grandmother        colon  . Diabetes Paternal Grandmother   . Heart disease Paternal Grandfather   . Stroke Neg Hx   . COPD Neg Hx    Social History   Tobacco Use  . Smoking status: Never Smoker  . Smokeless tobacco: Never Used  Substance Use Topics  .  Alcohol use: Yes    Comment: socially  . Drug use: No    Interim medical history since last visit reviewed. Allergies and medications reviewed  Review of Systems Per HPI unless specifically indicated above     Objective:    BP 110/68 (BP Location: Left Arm, Patient Position: Sitting, Cuff Size: Large)   Pulse 89   Temp 98.1 F (36.7 C) (Oral)   Ht 5' (1.524 m)   Wt 216 lb 6.4 oz (98.2 kg)   LMP 01/21/2018   SpO2 99%   BMI 42.26 kg/m   Wt Readings from Last 3 Encounters:  02/08/18 216 lb 6.4 oz (98.2 kg)  02/06/17 214 lb (97.1 kg)  11/07/16 219 lb (99.3 kg)    Physical Exam  Constitutional: She appears well-developed and well-nourished. No distress.  Morbidly obese;; weight gain 2+ pounds over last year  HENT:  Mouth/Throat: Mucous membranes are normal.  Mild diffuse thinning of hair on dome  Neck: No thyromegaly present.  Cardiovascular: Normal rate and regular rhythm.  Pulmonary/Chest: Effort normal and breath sounds normal. No respiratory distress.  Abdominal: She exhibits no distension.  Neurological: She is alert. She displays no tremor.  Psychiatric: She has a  normal mood and affect. Her mood appears not anxious. She does not exhibit a depressed mood.      Assessment & Plan:   Problem List Items Addressed This Visit      Digestive   Hyperinsulinemia    Check level today; continue metformin        Endocrine   PCOS (polycystic ovarian syndrome) - Primary    Check labs; continue spironolacton      Relevant Orders   COMPLETE METABOLIC PANEL WITH GFR (Completed)   Insulin, random (Completed)   Lipid panel (Completed)   TSH (Completed)   DHEA-sulfate (Completed)   Testosterone, Total, LC/MS/MS (Completed)     Other   Vitamin B12 deficiency    Check level      Relevant Orders   B12 (Completed)    Other Visit Diagnoses    Other fatigue       mild, check labs   Relevant Orders   VITAMIN D 25 Hydroxy (Vit-D Deficiency, Fractures) (Completed)    CBC with Differential/Platelet       Follow up plan: No Follow-up on file.  An after-visit summary was printed and given to the patient at check-out.  Please see the patient instructions which may contain other information and recommendations beyond what is mentioned above in the assessment and plan.  Meds ordered this encounter  Medications  . metFORMIN (GLUCOPHAGE-XR) 500 MG 24 hr tablet    Sig: Take 3 tablets (1,500 mg total) by mouth daily with breakfast.    Dispense:  270 tablet    Refill:  1    NEED ASAP!!    Orders Placed This Encounter  Procedures  . COMPLETE METABOLIC PANEL WITH GFR  . Insulin, random  . Lipid panel  . B12  . TSH  . VITAMIN D 25 Hydroxy (Vit-D Deficiency, Fractures)  . DHEA-sulfate  . Testosterone, Total, LC/MS/MS  . CBC with Differential/Platelet  . CBC with Differential/Platelet

## 2018-02-08 NOTE — Assessment & Plan Note (Signed)
Check level 

## 2018-02-08 NOTE — Assessment & Plan Note (Signed)
Check labs; continue spironolacton

## 2018-02-08 NOTE — Patient Instructions (Addendum)
We'll get labs today If you have not heard anything from my staff in a week about any orders/referrals/studies from today, please contact us here to follow-up [phone]) [phone] We wish you well with your practice

## 2018-02-10 ENCOUNTER — Encounter: Payer: Self-pay | Admitting: Family Medicine

## 2018-02-10 ENCOUNTER — Other Ambulatory Visit: Payer: Self-pay | Admitting: Family Medicine

## 2018-02-11 LAB — COMPLETE METABOLIC PANEL WITH GFR
AG RATIO: 1.3 (calc) (ref 1.0–2.5)
ALT: 11 U/L (ref 6–29)
AST: 13 U/L (ref 10–30)
Albumin: 4.3 g/dL (ref 3.6–5.1)
Alkaline phosphatase (APISO): 70 U/L (ref 33–115)
BILIRUBIN TOTAL: 0.3 mg/dL (ref 0.2–1.2)
BUN: 15 mg/dL (ref 7–25)
CALCIUM: 9.4 mg/dL (ref 8.6–10.2)
CHLORIDE: 102 mmol/L (ref 98–110)
CO2: 23 mmol/L (ref 20–32)
Creat: 0.85 mg/dL (ref 0.50–1.10)
GFR, EST AFRICAN AMERICAN: 111 mL/min/{1.73_m2} (ref >=60)
GFR, EST NON AFRICAN AMERICAN: 96 mL/min/{1.73_m2} (ref >=60)
GLOBULIN: 3.2 g/dL (ref 1.9–3.7)
Glucose, Bld: 92 mg/dL (ref 65–99)
Potassium: 4.5 mmol/L (ref 3.5–5.3)
SODIUM: 136 mmol/L (ref 135–146)
TOTAL PROTEIN: 7.5 g/dL (ref 6.1–8.1)

## 2018-02-11 LAB — VITAMIN D 25 HYDROXY (VIT D DEFICIENCY, FRACTURES): Vit D, 25-Hydroxy: 35 ng/mL (ref 30–100)

## 2018-02-11 LAB — CBC WITH DIFFERENTIAL/PLATELET
BASOS PCT: 0.4 %
Basophils Absolute: 45 cells/uL (ref 0–200)
EOS ABS: 269 {cells}/uL (ref 15–500)
EOS PCT: 2.4 %
HCT: 39.3 % (ref 35.0–45.0)
HEMOGLOBIN: 13.5 g/dL (ref 11.7–15.5)
Lymphs Abs: 2733 cells/uL (ref 850–3900)
MCH: 28.1 pg (ref 27.0–33.0)
MCHC: 34.4 g/dL (ref 32.0–36.0)
MCV: 81.9 fL (ref 80.0–100.0)
MONOS PCT: 6.6 %
MPV: 10.7 fL (ref 7.5–12.5)
NEUTROS ABS: 7414 {cells}/uL (ref 1500–7800)
Neutrophils Relative %: 66.2 %
PLATELETS: 305 10*3/uL (ref 140–400)
RBC: 4.8 10*6/uL (ref 3.80–5.10)
RDW: 11.9 % (ref 11.0–15.0)
TOTAL LYMPHOCYTE: 24.4 %
WBC mixed population: 739 cells/uL (ref 200–950)
WBC: 11.2 10*3/uL — AB (ref 3.8–10.8)

## 2018-02-11 LAB — LIPID PANEL
Cholesterol: 169 mg/dL (ref <200)
HDL: 43 mg/dL — ABNORMAL LOW (ref >50)
LDL Cholesterol (Calc): 91 mg/dL (calc)
NON-HDL CHOLESTEROL (CALC): 126 mg/dL (ref <130)
TRIGLYCERIDES: 263 mg/dL — AB (ref <150)
Total CHOL/HDL Ratio: 3.9 (calc) (ref <5.0)

## 2018-02-11 LAB — INSULIN, RANDOM: Insulin: 24.2 u[IU]/mL — ABNORMAL HIGH (ref 2.0–19.6)

## 2018-02-11 LAB — TESTOSTERONE, TOTAL, LC/MS/MS: Testosterone, Total, LC-MS-MS: 56 ng/dL — ABNORMAL HIGH (ref 2–45)

## 2018-02-11 LAB — DHEA-SULFATE: DHEA SO4: 187 ug/dL (ref 18–391)

## 2018-02-11 LAB — TSH: TSH: 2.41 m[IU]/L

## 2018-02-11 LAB — VITAMIN B12: Vitamin B-12: 383 pg/mL (ref 200–1100)

## 2018-02-11 NOTE — Telephone Encounter (Signed)
[facility]

## 2018-06-03 ENCOUNTER — Other Ambulatory Visit: Payer: Self-pay | Admitting: Family Medicine

## 2018-06-03 DIAGNOSIS — E282 Polycystic ovarian syndrome: Secondary | ICD-10-CM

## 2018-06-03 NOTE — Telephone Encounter (Signed)
Left detailed voicemail and sent to [facility]

## 2018-06-03 NOTE — Telephone Encounter (Signed)
Patient stated she was going to find another doctor Please confirm; recommend she get meds from her new doctor; thank you

## 2018-06-03 NOTE — Telephone Encounter (Signed)
Can you confirm if she switched doctors please? If so, can you document and remove my name as primary? Thank you

## 2019-02-02 ENCOUNTER — Telehealth: Payer: Self-pay | Admitting: Family Medicine

## 2019-02-03 NOTE — Telephone Encounter (Signed)
Patient transferred care Please remove my name from PCP

## 2019-02-04 NOTE — Telephone Encounter (Signed)
done

## 2021-01-12 ENCOUNTER — Other Ambulatory Visit: Payer: Self-pay | Admitting: Chiropractor

## 2021-01-12 ENCOUNTER — Ambulatory Visit
Admission: RE | Admit: 2021-01-12 | Discharge: 2021-01-12 | Disposition: A | Discharge status: Home / Self Care | Payer: BC Managed Care – PPO | Source: Ambulatory Visit | Attending: Chiropractor | Admitting: Chiropractor

## 2021-01-12 DIAGNOSIS — S338XXA Sprain of other parts of lumbar spine and pelvis, initial encounter: Secondary | ICD-10-CM | POA: Diagnosis present

## 2021-01-12 DIAGNOSIS — S134XXA Sprain of ligaments of cervical spine, initial encounter: Secondary | ICD-10-CM

## 2021-01-12 DIAGNOSIS — M75102 Unspecified rotator cuff tear or rupture of left shoulder, not specified as traumatic: Secondary | ICD-10-CM

## 2022-05-31 ENCOUNTER — Encounter: Payer: Self-pay | Admitting: Nurse Practitioner

## 2022-05-31 ENCOUNTER — Ambulatory Visit: Payer: Self-pay | Admitting: Nurse Practitioner

## 2022-05-31 DIAGNOSIS — Z113 Encounter for screening for infections with a predominantly sexual mode of transmission: Secondary | ICD-10-CM

## 2022-05-31 LAB — WET PREP FOR TRICH, YEAST, CLUE
Trichomonas Exam: NEGATIVE
Yeast Exam: NEGATIVE

## 2022-05-31 LAB — HM HIV SCREENING LAB: HM HIV Screening: NEGATIVE

## 2022-05-31 NOTE — Progress Notes (Signed)
Patient seen for STD testing. Declines S/S. Declines condoms. Wet prep reviewed, no tx per standing orders.

## 2022-05-31 NOTE — Progress Notes (Signed)
[facility] Department  STI clinic/screening visit [place] [address] [phone]  Subjective:  Kayla Hammond is a 31 y.o. female being seen today for an STI screening visit. The patient reports they do not have symptoms.  Patient reports that they do not desire a pregnancy in the next year.   They reported they are not interested in discussing contraception today.  Currently on OCPs.   No LMP recorded.  Irregular periods, last period 6 months ago.     Patient has the following medical conditions:   Patient Active Problem List   Diagnosis Date Noted   Hyperinsulinemia 02/06/2017   Medication monitoring encounter 11/12/2016   Screen for STD (sexually transmitted disease) 11/07/2016   Encounter for screening for cervical cancer 02/04/2016   Tonsillar hypertrophy 10/18/2015   Snoring 10/18/2015   Cramps, muscle, general 10/18/2015   Vitamin B12 deficiency 06/14/2015   Hypertriglyceridemia 06/14/2015   PCOS (polycystic ovarian syndrome) 06/11/2015   Morbid obesity ([place])    Psoriasis     Chief Complaint  Patient presents with   SEXUALLY TRANSMITTED DISEASE    HPI  Patient reports to clinic today for STD screening.  Patient is currently asymptomatic.    Last HIV test per patient/review of record was 10/28/2021. Patient reports last pap was 09/30/2019.   Screening for MPX risk: Does the patient have an unexplained rash? No Is the patient MSM? No Does the patient endorse multiple sex partners or anonymous sex partners? No Did the patient have close or sexual contact with a person diagnosed with MPX? No Has the patient traveled outside the [place] where MPX is endemic? No Is there a high clinical suspicion for MPX-- evidenced by one of the following No  -Unlikely to be chickenpox  -Lymphadenopathy  -Rash that present in same phase of evolution on any given body part See flowsheet for further details and programmatic requirements.    Immunization history:  Immunization History  Administered Date(s) Administered   DTaP 04/29/1993, 09/12/1993, 12/26/1993, 08/11/1994, 07/30/1997   HPV Quadrivalent 09/15/2010, 01/25/2011, 06/11/2015   Hepatitis A 09/15/2010   Hepatitis B 03/28/1993, 04/29/1993, 09/12/1993   HiB (PRP-OMP) 04/29/1993, 09/12/1993, 12/26/1993, 08/11/1994   IPV 04/29/1993, 09/12/1993, 12/26/1993, 08/15/1994   Influenza-Unspecified 09/18/2015   MMR 08/15/1994, 07/30/1997   Meningococcal Polysaccharide 09/15/2010   PFIZER(Purple Top)SARS-COV-2 Vaccination 01/30/2020, 02/20/2020   Td 01/23/2006, 09/15/2010   Tdap 05/18/2012   Varicella 09/15/2010, 01/25/2011     The following portions of the patient's history were reviewed and updated as appropriate: allergies, current medications, past medical history, past social history, past surgical history and problem list.  Objective:  There were no vitals filed for this visit.  Physical Exam Constitutional:      Appearance: Normal appearance.  HENT:     Head: Normocephalic. No abrasion, masses or laceration. Hair is normal.     Mouth/Throat:     Mouth: No oral lesions.     Pharynx: No oropharyngeal exudate or posterior oropharyngeal erythema.     Tonsils: No tonsillar exudate or tonsillar abscesses.  Eyes:     General: Lids are normal.        Right eye: No discharge.        Left eye: No discharge.     Conjunctiva/sclera: Conjunctivae normal.     Right eye: No exudate.    Left eye: No exudate. Abdominal:     General: Abdomen is flat.     Palpations: Abdomen is soft.  Tenderness: There is no abdominal tenderness. There is no rebound.  Genitourinary:    Pubic Area: No rash or pubic lice.      Labia:        Right: No rash, tenderness, lesion or injury.        Left: No rash, tenderness, lesion or injury.      Vagina: Normal. No vaginal discharge, erythema or lesions.     Cervix: No cervical motion tenderness, discharge, lesion or erythema.      Uterus: Not enlarged and not tender.      Rectum: Normal.     Comments: Amount Discharge: small Odor: No pH: 4.5 Adheres to vaginal wall: No Color: discharge is the same color as the Kayla Hammond swab.  Lymphadenopathy:     Cervical: No cervical adenopathy.     Right cervical: No superficial, deep or posterior cervical adenopathy.    Left cervical: No superficial, deep or posterior cervical adenopathy.     Upper Body:     Right upper body: No supraclavicular, axillary or epitrochlear adenopathy.     Left upper body: No supraclavicular, axillary or epitrochlear adenopathy.     Lower Body: No right inguinal adenopathy. No left inguinal adenopathy.  Skin:    Findings: No lesion or rash.      Assessment and Plan:  Kayla Hammond is a 31 y.o. female presenting to the [facility] Department for STI screening  1. Screening examination for venereal disease -31 year old female in clinic today for STD screening. -Patient accepted all screenings including oral GC, vaginal CT/GC, wet prep, and bloodwork for HIV/RPR.  Patient meets criteria for HepB screening? No. Ordered? No - low risk  Patient meets criteria for HepC screening? No. Ordered? No - low risk   Treat wet prep per standing order Discussed time line for [facility] results and that patient will be called with positive results and encouraged patient to call if she had not heard in 2 weeks.  Counseled to return or seek care for continued or worsening symptoms Recommended condom use with all sex  Patient is currently using Hormonal Contraception: Injection, Rings and Patches to prevent pregnancy.    - HIV [facility] - Syphilis Serology, [place] [facility] - [place]     Return if symptoms worsen or fail to improve.    Gregary Cromer, FNP

## 2022-06-04 LAB — GONOCOCCUS CULTURE

## 2024-01-22 ENCOUNTER — Ambulatory Visit: Payer: 59 | Attending: Otolaryngology

## 2024-01-22 DIAGNOSIS — Z6841 Body Mass Index (BMI) 40.0 and over, adult: Secondary | ICD-10-CM | POA: Insufficient documentation

## 2024-01-22 DIAGNOSIS — R0683 Snoring: Secondary | ICD-10-CM | POA: Insufficient documentation
